# Patient Record
Sex: Female | Born: 1955 | Race: Black or African American | Hispanic: No | Marital: Single | Smoking: Never smoker
Health system: Southern US, Community
[De-identification: ages and names within clinical notes are randomized; demographics above are authoritative.]

## PROBLEM LIST (undated history)

## (undated) DIAGNOSIS — I10 Essential (primary) hypertension: Secondary | ICD-10-CM

## (undated) DIAGNOSIS — M1990 Unspecified osteoarthritis, unspecified site: Secondary | ICD-10-CM

## (undated) DIAGNOSIS — J45909 Unspecified asthma, uncomplicated: Secondary | ICD-10-CM

## (undated) DIAGNOSIS — E119 Type 2 diabetes mellitus without complications: Secondary | ICD-10-CM

## (undated) DIAGNOSIS — T7840XA Allergy, unspecified, initial encounter: Secondary | ICD-10-CM

## (undated) HISTORY — DX: Unspecified asthma, uncomplicated: J45.909

## (undated) HISTORY — DX: Unspecified osteoarthritis, unspecified site: M19.90

## (undated) HISTORY — PX: TUBAL LIGATION: SHX77

## (undated) HISTORY — PX: ABDOMINAL HYSTERECTOMY: SHX81

## (undated) HISTORY — DX: Essential (primary) hypertension: I10

## (undated) HISTORY — DX: Type 2 diabetes mellitus without complications: E11.9

## (undated) HISTORY — DX: Allergy, unspecified, initial encounter: T78.40XA

---

## 1999-12-24 ENCOUNTER — Encounter: Admission: RE | Admit: 1999-12-24 | Discharge: 1999-12-24 | Payer: Self-pay | Admitting: Gynecology

## 1999-12-24 ENCOUNTER — Encounter: Payer: Self-pay | Admitting: Family Medicine

## 2000-11-03 ENCOUNTER — Encounter: Admission: RE | Admit: 2000-11-03 | Discharge: 2001-02-01 | Payer: Self-pay | Admitting: Family Medicine

## 2001-01-14 ENCOUNTER — Encounter: Admission: RE | Admit: 2001-01-14 | Discharge: 2001-01-14 | Payer: Self-pay | Admitting: *Deleted

## 2001-01-14 ENCOUNTER — Encounter: Payer: Self-pay | Admitting: *Deleted

## 2002-01-18 ENCOUNTER — Encounter: Admission: RE | Admit: 2002-01-18 | Discharge: 2002-01-18 | Payer: Self-pay | Admitting: *Deleted

## 2002-01-18 ENCOUNTER — Encounter: Payer: Self-pay | Admitting: *Deleted

## 2003-03-30 ENCOUNTER — Encounter: Admission: RE | Admit: 2003-03-30 | Discharge: 2003-03-30 | Payer: Self-pay | Admitting: Internal Medicine

## 2003-03-30 ENCOUNTER — Encounter: Payer: Self-pay | Admitting: Internal Medicine

## 2008-11-01 ENCOUNTER — Encounter: Admission: RE | Admit: 2008-11-01 | Discharge: 2008-11-01 | Payer: Self-pay | Admitting: Family Medicine

## 2009-01-09 ENCOUNTER — Encounter: Admission: RE | Admit: 2009-01-09 | Discharge: 2009-01-09 | Payer: Self-pay | Admitting: Family Medicine

## 2010-09-29 ENCOUNTER — Encounter: Payer: Self-pay | Admitting: Family Medicine

## 2011-03-14 ENCOUNTER — Other Ambulatory Visit: Payer: Self-pay | Admitting: Family Medicine

## 2011-03-14 DIAGNOSIS — Z1231 Encounter for screening mammogram for malignant neoplasm of breast: Secondary | ICD-10-CM

## 2011-04-01 ENCOUNTER — Ambulatory Visit
Admission: RE | Admit: 2011-04-01 | Discharge: 2011-04-01 | Disposition: A | Discharge status: Home / Self Care | Payer: BC Managed Care – PPO | Source: Ambulatory Visit | Attending: Family Medicine | Admitting: Family Medicine

## 2011-04-01 DIAGNOSIS — Z1231 Encounter for screening mammogram for malignant neoplasm of breast: Secondary | ICD-10-CM

## 2012-03-01 ENCOUNTER — Other Ambulatory Visit: Payer: Self-pay | Admitting: Family Medicine

## 2012-03-01 ENCOUNTER — Ambulatory Visit (INDEPENDENT_AMBULATORY_CARE_PROVIDER_SITE_OTHER): Payer: Federal, State, Local not specified - PPO | Admitting: Family Medicine

## 2012-03-01 VITALS — BP 143/84 | HR 82 | Temp 98.2°F | Resp 16 | Ht 64.0 in | Wt 258.2 lb

## 2012-03-01 DIAGNOSIS — R21 Rash and other nonspecific skin eruption: Secondary | ICD-10-CM

## 2012-03-01 DIAGNOSIS — R252 Cramp and spasm: Secondary | ICD-10-CM

## 2012-03-01 DIAGNOSIS — M549 Dorsalgia, unspecified: Secondary | ICD-10-CM

## 2012-03-01 DIAGNOSIS — R269 Unspecified abnormalities of gait and mobility: Secondary | ICD-10-CM

## 2012-03-01 DIAGNOSIS — E119 Type 2 diabetes mellitus without complications: Secondary | ICD-10-CM | POA: Insufficient documentation

## 2012-03-01 DIAGNOSIS — E669 Obesity, unspecified: Secondary | ICD-10-CM

## 2012-03-01 LAB — BASIC METABOLIC PANEL
BUN: 19 mg/dL (ref 6–23)
CO2: 28 mEq/L (ref 19–32)
Calcium: 9.3 mg/dL (ref 8.4–10.5)
Glucose, Bld: 314 mg/dL — ABNORMAL HIGH (ref 70–99)
Sodium: 138 mEq/L (ref 135–145)

## 2012-03-01 MED ORDER — TRIAMCINOLONE ACETONIDE 0.1 % EX CREA: TOPICAL_CREAM | Freq: Two times a day (BID) | CUTANEOUS | Status: AC

## 2012-03-01 MED ORDER — CYCLOBENZAPRINE HCL 10 MG PO TABS: 10.0000 mg | ORAL_TABLET | Freq: Two times a day (BID) | ORAL | Status: AC | PRN

## 2012-03-01 MED ORDER — METFORMIN HCL ER (MOD) 500 MG PO TB24: 500.0000 mg | ORAL_TABLET | Freq: Two times a day (BID) | ORAL | Status: DC

## 2012-03-01 MED ORDER — METFORMIN HCL ER (OSM) 500 MG PO TB24: ORAL_TABLET | ORAL | Status: DC

## 2012-03-01 NOTE — Progress Notes (Signed)
Patient Name: Sarah Barron Date of Birth: 02-26-1956 Medical Record Number: [id number] Gender: female Date of Encounter: 03/01/2012  History of Present Illness:  Sarah Barron is a 56 y.o. very pleasant female patient who presents with the following:  She is here with back pain and a rash (rash is not on her back) today. The back pain started about a week ago.  Insidious onset, in her lower back- in the middle.  No known injury.  She thinks she has a pulled muscle.  She works for the [employer].  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  She has had some lower back pain with occasional flares in the past.  She started her current job about 1 year ago and this has caused more frequent flares.  She feels that she is very tired by the end of the week.    She also notes an itchy rash which is on both arms. She first noted this about a week ago as well.  She does tend to have allergies and has been outside some.  She has not tried any creams for this yet.   She has NIDDM.  She has actually been out of her metformin for a few weeks, and is not sure when her last A1c was done.   Also has noted some leg cramps for the last several months.  These are worse at night also.   There is no problem list on file for this patient.  No past medical history on file. No past surgical history on file. History  Substance Use Topics  . Smoking status: Not on file  . Smokeless tobacco: Not on file  . Alcohol Use: Not on file   No family history on file. No Known Allergies  Medication list has been reviewed and updated.  Prior to Admission medications   Medication Sig Start Date End Date Taking? Authorizing Provider  lisinopril-hydrochlorothiazide (PRINZIDE,ZESTORETIC) 20-25 MG per tablet Take 1 tablet by mouth daily.   Yes Historical Provider, MD  metFORMIN (GLUMETZA) 500 MG (MOD) 24 hr tablet Take 500 mg by mouth 2 (two) times daily with a meal. Per patient takes 4 tabs daily   Yes  Historical Provider, MD    Review of Systems:  As per HPI- otherwise negative.   Physical Examination: Filed Vitals:   03/01/12 0945  BP: 143/84  Pulse: 82  Temp: 98.2 F (36.8 C)  Resp: 16   Filed Vitals:   03/01/12 0945  Height: 5\' 4"  (1.626 m)  Weight: 258 lb 3.2 oz (117.119 kg)   Body mass index is 44.32 kg/(m^2). Ideal Body Weight: Weight in (lb) to have BMI = 25: 145.3   GEN: WDWN, NAD, Non-toxic, A & O x 3, obese HEENT: Atraumatic, Normocephalic. Neck supple. No masses, No LAD.  Tm and oropharynx wnl, PEERL, EOMI Ears and Nose: No external deformity. CV: RRR, No M/G/R. No JVD. No thrill. No extra heart sounds. PULM: CTA B, no wheezes, crackles, rhonchi. No retractions. No resp. distress. No accessory muscle use. ABD: S, NT, ND, +BS. No rebound. No HSM. EXTR: No c/c/e.  Normal calves NEURO Normal gait.  PSYCH: Normally interactive. Conversant. Not depressed or anxious appearing.  Calm demeanor.  Back: there is tenderness over her lumbar spine, but she has full ROM and does not have any numbness or tingling of her legs.  Normal LE strength, sensation and DTR.    Rash: there is a diffuse papular rash on her  forearms, worse over her left forearm.  It appears consistent with a contact dermatitis.  No other areas affected, hands are spared.  Results for orders placed in visit on 03/01/12  POCT GLYCOSYLATED HEMOGLOBIN (HGB A1C)      Component Value Range   Hemoglobin A1C 8.5     Assessment and Plan: 1. Back pain  cyclobenzaprine (FLEXERIL) 10 MG tablet  2. DM (diabetes mellitus)  POCT glycosylated hemoglobin (Hb A1C), metFORMIN (GLUMETZA) 500 MG (MOD) 24 hr tablet  3. Obesity    4. Rash  triamcinolone cream (KENALOG) 0.1 %  5. Leg cramps  Basic metabolic panel   Will treat rash with triamcinolone, and refilled her metformin.  As she has noted some GI symptoms with 2000mg  will try just 1000mg  at night and see how this affects her A1c.  Counseled that she really needs  to lose weight and exercise more- deconditioning and morbid obesity are not only contributing to her DM, but I suspect they are the cause of her back pain as well.  At the end of our visit Sarah Barron mentioned that she will probably also need for me to fill out FMLA paperwork for her to take "some time off to rest" from her job.  I am willing to give her some short term days to use PRN, but she will need to work on her lifestyle as well.  She is about 100 lbs over her ideal weight currently. She plans to follow- up with [place] soon- I will also be in touch with her pending her BMP which was done in response to leg cramps.    Abbe Amsterdam, MD

## 2012-03-02 ENCOUNTER — Encounter: Payer: Self-pay | Admitting: Family Medicine

## 2012-03-18 ENCOUNTER — Telehealth: Payer: Self-pay

## 2012-03-18 ENCOUNTER — Ambulatory Visit (INDEPENDENT_AMBULATORY_CARE_PROVIDER_SITE_OTHER): Payer: Federal, State, Local not specified - PPO | Admitting: Physician Assistant

## 2012-03-18 VITALS — BP 138/82 | HR 87 | Temp 98.6°F | Resp 17 | Ht 65.0 in | Wt 258.0 lb

## 2012-03-18 DIAGNOSIS — J069 Acute upper respiratory infection, unspecified: Secondary | ICD-10-CM

## 2012-03-18 DIAGNOSIS — R21 Rash and other nonspecific skin eruption: Secondary | ICD-10-CM

## 2012-03-18 DIAGNOSIS — R05 Cough: Secondary | ICD-10-CM

## 2012-03-18 MED ORDER — ALBUTEROL SULFATE (2.5 MG/3ML) 0.083% IN NEBU
2.5000 mg | INHALATION_SOLUTION | Freq: Once | RESPIRATORY_TRACT | Status: AC
  Administered 2012-03-18: 2.5 mg via RESPIRATORY_TRACT

## 2012-03-18 MED ORDER — PROMETHAZINE-DM 6.25-15 MG/5ML PO SYRP: 5.0000 mL | ORAL_SOLUTION | Freq: Every day | ORAL | Status: AC

## 2012-03-18 MED ORDER — IPRATROPIUM BROMIDE 0.06 % NA SOLN: 2.0000 | Freq: Four times a day (QID) | NASAL | Status: DC

## 2012-03-18 MED ORDER — ALBUTEROL SULFATE HFA 108 (90 BASE) MCG/ACT IN AERS: 2.0000 | INHALATION_SPRAY | RESPIRATORY_TRACT | Status: DC | PRN

## 2012-03-18 NOTE — Progress Notes (Signed)
  Subjective:    Patient ID: Sarah Barron, female    DOB: Jul 22, 1956, 56 y.o.   MRN: [id number]  HPI Sarah Barron comes in today with 4 days of URI symptoms.  ST, scratchy throat, nasal congestion, tight cough that hurts her chest. She has not had fever or chills.  She states her cough is productive at times and that she has asthma but no current inhalers.    She is transitioning her care here from [facility] [address] and asks for FMLA papers to be filled out for DM and asthma. We do not have records from [facility] at this point.  She still has a rash on her forearms that did not respond to TAC cream.  It is very itchy and she states that it is getting worse.  It is not on her body any where else.    Review of Systems As noted in HPI, otherwise negative     Objective:   Physical Exam  Constitutional: She is oriented to person, place, and time. She appears well-developed and well-nourished.  HENT:  Right Ear: Tympanic membrane normal.  Left Ear: Tympanic membrane normal.  Nose: Mucosal edema present.  Mouth/Throat: Oropharynx is clear and moist.  Cardiovascular: Normal rate and regular rhythm.   Pulmonary/Chest: Effort normal. She has wheezes (faint wheezes throughout).       Tight paroxysmal cough  Lymphadenopathy:    She has no cervical adenopathy.  Neurological: She is alert and oriented to person, place, and time.  Skin: Skin is warm. Rash noted.       Left dorsal forearm with multiple scattered papular lesions with a sebaceous appearance to it. Only to elbow.  A few scattered lesions on right forearm.    Albuterol Neb treatment x 1 Poor effort with pre peakflow  Pt clears after nebulizer.  PF 275 post       Assessment & Plan:  URI Asthma Rash DM, uncontrolled   Altrovent NS, Albuterol Inhaler, phenergan DM Add zyrtec for rash.  If no resolution, RTC for punch If asthma worsens RTC.  Prednisone held for now as BS are uncontrolled and not monitored at home.  If pt  worsens, she may need treatment. Stated to pt that if she was able to provide [place] with records we could consider FMLA but there is no guarantee that we will fill it out.  Seen with Dr. Perrin Maltese

## 2012-03-18 NOTE — Patient Instructions (Addendum)
Get Zyrtec over the counter for itching and allergies If wheezing and cough get worse, call or return to clinic Watch rash.  If it does not respond return to clinic for a biopsy

## 2012-04-06 ENCOUNTER — Ambulatory Visit (INDEPENDENT_AMBULATORY_CARE_PROVIDER_SITE_OTHER): Payer: Federal, State, Local not specified - PPO | Admitting: Physician Assistant

## 2012-04-06 VITALS — BP 174/78 | HR 96 | Temp 98.2°F | Resp 18 | Ht 65.5 in | Wt 264.0 lb

## 2012-04-06 DIAGNOSIS — L282 Other prurigo: Secondary | ICD-10-CM

## 2012-04-06 DIAGNOSIS — J45909 Unspecified asthma, uncomplicated: Secondary | ICD-10-CM

## 2012-04-06 MED ORDER — FLUTICASONE-SALMETEROL 100-50 MCG/DOSE IN AEPB: 1.0000 | INHALATION_SPRAY | Freq: Two times a day (BID) | RESPIRATORY_TRACT | Status: DC

## 2012-04-06 MED ORDER — ALBUTEROL SULFATE HFA 108 (90 BASE) MCG/ACT IN AERS: 2.0000 | INHALATION_SPRAY | RESPIRATORY_TRACT | Status: DC | PRN

## 2012-04-06 NOTE — Progress Notes (Signed)
  Subjective:    Patient ID: Sarah Barron, female    DOB: December 21, 1955, 56 y.o.   MRN: [id number]  HPI Patient presents for follow up of bilateral upper extremity rash. She has had this rash for about 2 months and has been seen several times. She has been using triamcinolone cream which does help but only temporarily.  She believes it could be related to her job. She works for [employer] in a dusty environment. Believes this has caused the rash and her asthma to flare. She is having to use her albuterol inhaler daily.  Currently uses only albuterol inhaler and does not have a daily steroid inhaler.     Review of Systems  All other systems reviewed and are negative.       Objective:   Physical Exam  Constitutional: She is oriented to person, place, and time. She appears well-developed and well-nourished.  HENT:  Head: Normocephalic and atraumatic.  Right Ear: Hearing, tympanic membrane, external ear and ear canal normal.  Left Ear: Hearing, tympanic membrane, external ear and ear canal normal.  Mouth/Throat: Uvula is midline, oropharynx is clear and moist and mucous membranes are normal. No oropharyngeal exudate.  Neck: Normal range of motion.  Cardiovascular: Normal rate, regular rhythm and normal heart sounds.   Pulmonary/Chest: Effort normal and breath sounds normal.  Musculoskeletal: Normal range of motion.  Lymphadenopathy:    She has no cervical adenopathy.  Neurological: She is alert and oriented to person, place, and time.  Skin:     Psychiatric: She has a normal mood and affect. Her behavior is normal. Judgment and thought content normal.          Assessment & Plan:   1. Pruritic rash  Ambulatory referral to Dermatology  2. Asthma  albuterol (PROVENTIL HFA;VENTOLIN HFA) 108 (90 BASE) MCG/ACT inhaler, Fluticasone-Salmeterol (ADVAIR) 100-50 MCG/DOSE AEPB   Recommend continue triamcinolone until follow up with dermatology. Patient did not want a punch biopsy today.   She  left FMLA paperwork that I put in Maudia's box for completion.   Start Advair inhaler daily. Continue albuterol as needed as well as Zyrtec daily.

## 2012-04-13 ENCOUNTER — Encounter: Payer: Federal, State, Local not specified - PPO | Admitting: Family Medicine

## 2012-04-29 ENCOUNTER — Ambulatory Visit (INDEPENDENT_AMBULATORY_CARE_PROVIDER_SITE_OTHER): Payer: Federal, State, Local not specified - PPO | Admitting: Emergency Medicine

## 2012-04-29 VITALS — BP 120/98 | HR 79 | Temp 97.8°F | Resp 16 | Ht 65.0 in | Wt 267.0 lb

## 2012-04-29 DIAGNOSIS — J029 Acute pharyngitis, unspecified: Secondary | ICD-10-CM

## 2012-04-29 DIAGNOSIS — R05 Cough: Secondary | ICD-10-CM

## 2012-04-29 DIAGNOSIS — J45909 Unspecified asthma, uncomplicated: Secondary | ICD-10-CM

## 2012-04-29 DIAGNOSIS — E119 Type 2 diabetes mellitus without complications: Secondary | ICD-10-CM

## 2012-04-29 DIAGNOSIS — Z9109 Other allergy status, other than to drugs and biological substances: Secondary | ICD-10-CM

## 2012-04-29 DIAGNOSIS — J309 Allergic rhinitis, unspecified: Secondary | ICD-10-CM

## 2012-04-29 LAB — GLUCOSE, POCT (MANUAL RESULT ENTRY): POC Glucose: 171 mg/dl — AB (ref 70–99)

## 2012-04-29 MED ORDER — FLUTICASONE PROPIONATE 50 MCG/ACT NA SUSP: 2.0000 | Freq: Every day | NASAL | Status: DC

## 2012-04-29 MED ORDER — ALBUTEROL SULFATE (2.5 MG/3ML) 0.083% IN NEBU
2.5000 mg | INHALATION_SOLUTION | Freq: Once | RESPIRATORY_TRACT | Status: AC
  Administered 2012-04-29: 2.5 mg via RESPIRATORY_TRACT

## 2012-04-29 MED ORDER — BUDESONIDE-FORMOTEROL FUMARATE 160-4.5 MCG/ACT IN AERO: 2.0000 | INHALATION_SPRAY | Freq: Two times a day (BID) | RESPIRATORY_TRACT | Status: DC

## 2012-04-29 MED ORDER — PREDNISONE 20 MG PO TABS: ORAL_TABLET | ORAL | Status: DC

## 2012-04-29 NOTE — Progress Notes (Signed)
  Subjective:    Patient ID: Sarah Barron, female    DOB: [birth year], 56 y.o.   MRN: [id number]  HPI patient has a long history of allergies and asthma she tried the Advair but did not like it she said it made her sleepy. She has a significant amount of nasal congestion. She has not had any purulent nasal drainage nor has she had a productive cough.    Review of Systems     Objective:   Physical Exam HEENT exam reveals TMs clear nose congested throat is red. Chest is clear to auscultation and percussion without definite wheezes. There does appear to be some prolongation of expiration.   Results for orders placed in visit on 04/29/12  POCT RAPID STREP A (OFFICE)      Component Value Range   Rapid Strep A Screen Negative  Negative   Results for orders placed in visit on 04/29/12  POCT RAPID STREP A (OFFICE)      Component Value Range   Rapid Strep A Screen Negative  Negative  GLUCOSE, POCT (MANUAL RESULT ENTRY)      Component Value Range   POC Glucose 171 (*) 70 - 99 mg/dl      Assessment & Plan:  I suspect that his symptoms are allergy related. I have switched from Advair to Symbicort and use a Flonase nasal inhaler

## 2012-04-29 NOTE — Patient Instructions (Addendum)
Please try the inhalers and see if this helps with your asthma.Allergic Rhinitis Allergic rhinitis is when the mucous membranes in the nose respond to allergens. Allergens are particles in the air that cause your body to have an allergic reaction. This causes you to release allergic antibodies. Through a chain of events, these eventually cause you to release histamine into the blood stream (hence the use of antihistamines). Although meant to be protective to the body, it is this release that causes your discomfort, such as frequent sneezing, congestion and an itchy runny nose.  CAUSES  The pollen allergens may come from grasses, trees, and weeds. This is seasonal allergic rhinitis, or "hay fever." Other allergens cause year-round allergic rhinitis (perennial allergic rhinitis) such as house dust mite allergen, pet dander and mold spores.  SYMPTOMS   Nasal stuffiness (congestion).   Runny, itchy nose with sneezing and tearing of the eyes.   There is often an itching of the mouth, eyes and ears.  It cannot be cured, but it can be controlled with medications. DIAGNOSIS  If you are unable to determine the offending allergen, skin or blood testing may find it. TREATMENT   Avoid the allergen.   Medications and allergy shots (immunotherapy) can help.   Hay fever may often be treated with antihistamines in pill or nasal spray forms. Antihistamines block the effects of histamine. There are over-the-counter medicines that may help with nasal congestion and swelling around the eyes. Check with your caregiver before taking or giving this medicine.  If the treatment above does not work, there are many new medications your caregiver can prescribe. Stronger medications may be used if initial measures are ineffective. Desensitizing injections can be used if medications and avoidance fails. Desensitization is when a patient is given ongoing shots until the body becomes less sensitive to the allergen. Make sure  you follow up with your caregiver if problems continue. SEEK MEDICAL CARE IF:   You develop fever (more than 100.5 F (38.1 C).   You develop a cough that does not stop easily (persistent).   You have shortness of breath.   You start wheezing.   Symptoms interfere with normal daily activities.  Document Released: 05/20/2001 Document Revised: 08/14/2011 Document Reviewed: 11/29/2008 [facility] Patient Information 2012 [place], [place].

## 2012-05-18 ENCOUNTER — Ambulatory Visit (INDEPENDENT_AMBULATORY_CARE_PROVIDER_SITE_OTHER): Payer: Federal, State, Local not specified - PPO | Admitting: Emergency Medicine

## 2012-05-18 VITALS — BP 191/121 | HR 106 | Temp 98.2°F | Resp 28

## 2012-05-18 DIAGNOSIS — J45901 Unspecified asthma with (acute) exacerbation: Secondary | ICD-10-CM

## 2012-05-18 MED ORDER — ALBUTEROL SULFATE (2.5 MG/3ML) 0.083% IN NEBU: 5.0000 mg | INHALATION_SOLUTION | Freq: Once | RESPIRATORY_TRACT | Status: DC

## 2012-05-18 MED ORDER — IPRATROPIUM BROMIDE 0.02 % IN SOLN: 0.5000 mg | Freq: Once | RESPIRATORY_TRACT | Status: DC

## 2012-05-18 NOTE — Progress Notes (Signed)
  Subjective:    Patient ID: Sarah Barron, female    DOB: May 23, 1956, 56 y.o.   MRN: [id number]  Asthma She complains of chest tightness, cough, difficulty breathing, shortness of breath and wheezing. There is no frequent throat clearing, hemoptysis, hoarse voice or sputum production. This is a new problem. The current episode started today. The problem occurs constantly. The problem has been gradually worsening. The cough is non-productive. Associated symptoms include dyspnea on exertion, malaise/fatigue, nasal congestion, postnasal drip and rhinorrhea. Pertinent negatives include no appetite change, chest pain, ear congestion, ear pain, fever, headaches, heartburn, myalgias, orthopnea, PND, sneezing, sore throat, sweats, trouble swallowing or weight loss. Her symptoms are aggravated by climbing stairs and exercise. Her symptoms are alleviated by nothing. She reports no improvement on treatment. Risk factors for lung disease include no known risk factors. Her past medical history is significant for asthma. There is no history of bronchiectasis, bronchitis, COPD, emphysema or pneumonia.      Review of Systems  Constitutional: Positive for malaise/fatigue. Negative for fever, weight loss and appetite change.  HENT: Positive for rhinorrhea and postnasal drip. Negative for ear pain, sore throat, hoarse voice, sneezing and trouble swallowing.   Eyes: Negative.   Respiratory: Positive for cough, shortness of breath and wheezing. Negative for hemoptysis and sputum production.   Cardiovascular: Positive for dyspnea on exertion. Negative for chest pain and PND.  Gastrointestinal: Negative.  Negative for heartburn.  Genitourinary: Negative.   Musculoskeletal: Negative.  Negative for myalgias.  Neurological: Negative for headaches.       Objective:   Physical Exam  Constitutional: She is oriented to person, place, and time. She appears well-developed and well-nourished. She appears distressed.    HENT:  Head: Normocephalic and atraumatic.  Right Ear: External ear normal.  Left Ear: External ear normal.  Mouth/Throat: Oropharynx is clear and moist.  Eyes: Conjunctivae are normal. Pupils are equal, round, and reactive to light.  Neck: Normal range of motion. Neck supple.  Cardiovascular: Normal rate, normal heart sounds and intact distal pulses.  Exam reveals no gallop and no friction rub.   No murmur heard. Pulmonary/Chest: She is in respiratory distress. She has wheezes. She has no rales. She exhibits no tenderness.  Abdominal: Soft.  Musculoskeletal: Normal range of motion.  Neurological: She is alert and oriented to person, place, and time.  Skin: Skin is warm and dry.          Assessment & Plan:  Exacerbation asthma.  Used her albuterol MDI twice today with no improvement;  1620:  Still wheeze free, breathing easily with good full respirations.  To use albuterol q4h prn  I have reviewed and agree with documentation. Robert P. Merla Riches, M.D.

## 2012-05-21 ENCOUNTER — Ambulatory Visit (INDEPENDENT_AMBULATORY_CARE_PROVIDER_SITE_OTHER): Payer: Federal, State, Local not specified - PPO | Admitting: Family Medicine

## 2012-05-21 VITALS — BP 174/96 | HR 112 | Temp 98.3°F | Resp 20 | Wt 260.0 lb

## 2012-05-21 DIAGNOSIS — E109 Type 1 diabetes mellitus without complications: Secondary | ICD-10-CM

## 2012-05-21 DIAGNOSIS — E119 Type 2 diabetes mellitus without complications: Secondary | ICD-10-CM

## 2012-05-21 DIAGNOSIS — J45901 Unspecified asthma with (acute) exacerbation: Secondary | ICD-10-CM

## 2012-05-21 LAB — POCT CBC
Granulocyte percent: 57.5 %G (ref 37–80)
HCT, POC: 48.4 % — AB (ref 37.7–47.9)
Hemoglobin: 14.7 g/dL (ref 12.2–16.2)
Lymph, poc: 3.4 (ref 0.6–3.4)
MCH, POC: 29.7 pg (ref 27–31.2)
MCHC: 30.4 g/dL — AB (ref 31.8–35.4)
MCV: 97.7 fL — AB (ref 80–97)
MID (cbc): 0.7 (ref 0–0.9)
MPV: 8.6 fL (ref 0–99.8)
POC Granulocyte: 5.5 (ref 2–6.9)
POC LYMPH PERCENT: 35.2 %L (ref 10–50)
POC MID %: 7.3 %M (ref 0–12)
Platelet Count, POC: 378 10*3/uL (ref 142–424)
RBC: 4.95 M/uL (ref 4.04–5.48)
RDW, POC: 13.6 %
WBC: 9.6 10*3/uL (ref 4.6–10.2)

## 2012-05-21 LAB — POCT GLYCOSYLATED HEMOGLOBIN (HGB A1C): Hemoglobin A1C: 8.8

## 2012-05-21 LAB — GLUCOSE, POCT (MANUAL RESULT ENTRY): POC Glucose: 358 mg/dl — AB (ref 70–99)

## 2012-05-21 MED ORDER — IPRATROPIUM BROMIDE 0.02 % IN SOLN
0.5000 mg | Freq: Once | RESPIRATORY_TRACT | Status: AC
  Administered 2012-05-21: 0.5 mg via RESPIRATORY_TRACT

## 2012-05-21 MED ORDER — GLIMEPIRIDE 2 MG PO TABS: 2.0000 mg | ORAL_TABLET | Freq: Every day | ORAL | Status: DC

## 2012-05-21 MED ORDER — ALBUTEROL SULFATE (2.5 MG/3ML) 0.083% IN NEBU
2.5000 mg | INHALATION_SOLUTION | Freq: Once | RESPIRATORY_TRACT | Status: AC
  Administered 2012-05-21: 2.5 mg via RESPIRATORY_TRACT

## 2012-05-21 MED ORDER — MOMETASONE FURO-FORMOTEROL FUM 200-5 MCG/ACT IN AERO: 2.0000 | INHALATION_SPRAY | Freq: Two times a day (BID) | RESPIRATORY_TRACT | Status: DC

## 2012-05-21 NOTE — Progress Notes (Signed)
56 yo woman with known asthma who has been short of breath with wheezing over a week, much worse last Tuesday and again today.  No fever.  She is using the Proventil q4h along with the Atrovent and Flonase.  Works with [employer], [employer] (very dusty).  Diabetes has been a little high lately.  Polyuria present with some lightheadedness.  Weak as well   Objective: NAD, audible wheezing  Patient is obese and having mild dyspnea when she walks in to the office. Diffuse wheezes bilaterally but no rales at the bases Heart: Regular with borderline tachycardia, no gallop or murmur Extremities: Pink nailbeds, no edema  Patient was given a nebulizer treatment with Atrovent and albuterol with marked improvement in aeration.  Results for orders placed in visit on 04/29/12  POCT RAPID STREP A (OFFICE)      Component Value Range   Rapid Strep A Screen Negative  Negative  GLUCOSE, POCT (MANUAL RESULT ENTRY)      Component Value Range   POC Glucose 171 (*) 70 - 99 mg/dl   Results for orders placed in visit on 05/21/12  POCT CBC      Component Value Range   WBC 9.6  4.6 - 10.2 K/uL   Lymph, poc 3.4  0.6 - 3.4   POC LYMPH PERCENT 35.2  10 - 50 %L   MID (cbc) 0.7  0 - 0.9   POC MID % 7.3  0 - 12 %M   POC Granulocyte 5.5  2 - 6.9   Granulocyte percent 57.5  37 - 80 %G   RBC 4.95  4.04 - 5.48 M/uL   Hemoglobin 14.7  12.2 - 16.2 g/dL   HCT, POC 62.1 (*) 30.8 - 47.9 %   MCV 97.7 (*) 80 - 97 fL   MCH, POC 29.7  27 - 31.2 pg   MCHC 30.4 (*) 31.8 - 35.4 g/dL   RDW, POC 65.7     Platelet Count, POC 378  142 - 424 K/uL   MPV 8.6  0 - 99.8 fL  POCT GLYCOSYLATED HEMOGLOBIN (HGB A1C)      Component Value Range   Hemoglobin A1C 8.8    GLUCOSE, POCT (MANUAL RESULT ENTRY)      Component Value Range   POC Glucose 358 (*) 70 - 99 mg/dl   '  Assessment:  Moderately severe asthma,  Uncontrolled diabetes  Plan:  Add Dulera, recheck 48 hours 1. Asthma exacerbation  albuterol (PROVENTIL) (2.5 MG/3ML)  0.083% nebulizer solution 2.5 mg, ipratropium (ATROVENT) nebulizer solution 0.5 mg, POCT CBC, POCT glycosylated hemoglobin (Hb A1C), POCT glucose (manual entry), Mometasone Furo-Formoterol Fum 200-5 MCG/ACT AERO  2. Type 2 diabetes mellitus not at goal  glimepiride (AMARYL) 2 MG tablet  add amaryl 2 mg daily for better diabetes control

## 2012-05-23 ENCOUNTER — Ambulatory Visit (INDEPENDENT_AMBULATORY_CARE_PROVIDER_SITE_OTHER): Payer: Federal, State, Local not specified - PPO | Admitting: Emergency Medicine

## 2012-05-23 VITALS — BP 153/82 | HR 94 | Temp 98.0°F | Resp 16 | Ht 64.25 in | Wt 256.0 lb

## 2012-05-23 DIAGNOSIS — J45901 Unspecified asthma with (acute) exacerbation: Secondary | ICD-10-CM

## 2012-05-23 DIAGNOSIS — E119 Type 2 diabetes mellitus without complications: Secondary | ICD-10-CM

## 2012-05-23 MED ORDER — GLIMEPIRIDE 2 MG PO TABS: 2.0000 mg | ORAL_TABLET | Freq: Every day | ORAL | Status: DC

## 2012-05-23 NOTE — Progress Notes (Addendum)
Date:  05/23/2012   Name:  Sarah Barron   DOB:  05-03-1956   MRN:  [id number] Gender: female Age: 56 y.o.  PCP:  No primary provider on file.    Chief Complaint: Follow-up   History of Present Illness:  Sarah Barron is a 56 y.o. pleasant patient who presents with the following:  Has been having difficulty for some time with wheezing.  I treated her with neb aerosols and counseling about proper use of her MDI albuterol.  She was seen Friday by Dr Milus Glazier still wheezing and he added Dulera and added amaryl to her NIDDM treatment as her random BS was 170 at that time.  She has not checked her sugar since and says her asthma has improved but is not resolved and she is still wheezing.  No cough or fever, no coryza.  Has not used her albuterol since starting the dulera.  Patient Active Problem List  Diagnosis  . Obesity  . Diabetes mellitus type II    No past medical history on file.  No past surgical history on file.  History  Substance Use Topics  . Smoking status: Never Smoker   . Smokeless tobacco: Not on file  . Alcohol Use: Not on file    No family history on file.  No Known Allergies  Medication list has been reviewed and updated.  Outpatient Prescriptions Prior to Visit  Medication Sig Dispense Refill  . albuterol (PROVENTIL HFA;VENTOLIN HFA) 108 (90 BASE) MCG/ACT inhaler Inhale 2 puffs into the lungs every 4 (four) hours as needed for wheezing.  1 Inhaler  1  . budesonide-formoterol (SYMBICORT) 160-4.5 MCG/ACT inhaler Inhale 2 puffs into the lungs 2 (two) times daily.  1 Inhaler  3  . fluticasone (FLONASE) 50 MCG/ACT nasal spray Place 2 sprays into the nose daily.  16 g  6  . lisinopril-hydrochlorothiazide (PRINZIDE,ZESTORETIC) 20-25 MG per tablet Take 1 tablet by mouth daily.      . metformin (FORTAMET) 500 MG (OSM) 24 hr tablet Take 2 tablets at bedtime  60 tablet  3  . Mometasone Furo-Formoterol Fum 200-5 MCG/ACT AERO Inhale 2 puffs into the lungs 2  (two) times daily.  1 Inhaler  3  . glimepiride (AMARYL) 2 MG tablet Take 1 tablet (2 mg total) by mouth daily before breakfast.  30 tablet  3  . ipratropium (ATROVENT) 0.06 % nasal spray Place 2 sprays into the nose 4 (four) times daily.  15 mL  0  . triamcinolone cream (KENALOG) 0.1 % Apply topically 2 (two) times daily.  60 g  0   Facility-Administered Medications Prior to Visit  Medication Dose Route Frequency Provider Last Rate Last Dose  . albuterol (PROVENTIL) (2.5 MG/3ML) 0.083% nebulizer solution 5 mg  5 mg Nebulization Once Phillips Odor, MD      . ipratropium (ATROVENT) nebulizer solution 0.5 mg  0.5 mg Nebulization Once Phillips Odor, MD        Review of Systems:  As per HPI, otherwise negative.    Physical Examination: Filed Vitals:   05/23/12 1541  BP: 153/82  Pulse: 94  Temp: 98 F (36.7 C)  Resp: 16   Filed Vitals:   05/23/12 1541  Height: 5' 4.25" (1.632 m)  Weight: 256 lb (116.121 kg)   Body mass index is 43.60 kg/(m^2). Ideal Body Weight: Weight in (lb) to have BMI = 25: 146.5   GEN: WDWN, NAD, Non-toxic, A & O x 3 HEENT: Atraumatic, Normocephalic. Neck supple. No  masses, No LAD.  Oropharynx negative Ears and Nose: No external deformity.TM negative CV: RRR, No M/G/R. No JVD. No thrill. No extra heart sounds. PULM: CTA B, crackles, rhonchi. No retractions. No resp. distress. No accessory muscle use.  Fine scattered wheezing  ABD: S, NT, ND, +BS. No rebound. No HSM. EXTR: No c/c/e NEURO Normal gait.  PSYCH: Normally interactive. Conversant. Not depressed or anxious appearing.  Calm demeanor.    Assessment and Plan: Asthma NIDDM Reeducated regarding Albuterol Discussed need to check her FBS daily. Follow up in one month for recheck on NIDDM  Carmelina Dane, MD I have reviewed and agree with documentation. Robert P. Merla Riches, M.D.

## 2012-06-02 ENCOUNTER — Telehealth: Payer: Self-pay | Admitting: Radiology

## 2012-06-02 DIAGNOSIS — E119 Type 2 diabetes mellitus without complications: Secondary | ICD-10-CM

## 2012-06-02 MED ORDER — METFORMIN HCL ER (OSM) 500 MG PO TB24: ORAL_TABLET | ORAL | Status: DC

## 2012-06-02 MED ORDER — GLIMEPIRIDE 2 MG PO TABS: 2.0000 mg | ORAL_TABLET | Freq: Every day | ORAL | Status: DC

## 2012-06-02 MED ORDER — LISINOPRIL-HYDROCHLOROTHIAZIDE 20-25 MG PO TABS: 2.0000 | ORAL_TABLET | Freq: Every day | ORAL | Status: DC

## 2012-06-02 NOTE — Telephone Encounter (Signed)
Patient has come in to office wanting renewals on her Lisinopril 20/25mg   and Metformin (fortamet) 500mg  she also states Dr Dareen Piano gave her another medication for her diabetes at last office visit, but this did not go through to pharmacy. Please advise what she was to have and if we can send these in for her to [facility] [address]. Please advise and I will call patient [phone]

## 2012-06-02 NOTE — Telephone Encounter (Signed)
I instructed her to increase her dose of glimiperide and did not give her an additional medication.  Prescriptions sent

## 2012-06-02 NOTE — Telephone Encounter (Signed)
Left message for him to advise  

## 2012-06-28 ENCOUNTER — Other Ambulatory Visit: Payer: Self-pay | Admitting: Family Medicine

## 2012-06-28 ENCOUNTER — Other Ambulatory Visit: Payer: Self-pay | Admitting: Physician Assistant

## 2012-06-28 NOTE — Telephone Encounter (Signed)
Per last note 05/23/12 pt to follow up on DM in one month, needs OV for more

## 2012-06-28 NOTE — Telephone Encounter (Signed)
Per 05/23/12 note, patient to follow up on DM in one month, needs OV

## 2012-06-29 ENCOUNTER — Other Ambulatory Visit: Payer: Self-pay | Admitting: Radiology

## 2012-06-30 ENCOUNTER — Encounter: Payer: Self-pay | Admitting: *Deleted

## 2012-06-30 DIAGNOSIS — R21 Rash and other nonspecific skin eruption: Secondary | ICD-10-CM | POA: Insufficient documentation

## 2012-08-20 ENCOUNTER — Other Ambulatory Visit: Payer: Self-pay | Admitting: Physician Assistant

## 2012-08-20 NOTE — Telephone Encounter (Signed)
Needs office visit.

## 2012-09-03 ENCOUNTER — Ambulatory Visit (INDEPENDENT_AMBULATORY_CARE_PROVIDER_SITE_OTHER): Payer: Federal, State, Local not specified - PPO | Admitting: Family Medicine

## 2012-09-03 VITALS — BP 150/89 | HR 108 | Temp 100.0°F | Resp 17 | Ht 65.0 in | Wt 258.0 lb

## 2012-09-03 DIAGNOSIS — R52 Pain, unspecified: Secondary | ICD-10-CM

## 2012-09-03 DIAGNOSIS — R509 Fever, unspecified: Secondary | ICD-10-CM

## 2012-09-03 DIAGNOSIS — R05 Cough: Secondary | ICD-10-CM

## 2012-09-03 DIAGNOSIS — R062 Wheezing: Secondary | ICD-10-CM

## 2012-09-03 DIAGNOSIS — J45909 Unspecified asthma, uncomplicated: Secondary | ICD-10-CM

## 2012-09-03 LAB — POCT CBC
HCT, POC: 46.4 % (ref 37.7–47.9)
Hemoglobin: 14.4 g/dL (ref 12.2–16.2)
Lymph, poc: 2.3 (ref 0.6–3.4)
MCH, POC: 30.8 pg (ref 27–31.2)
MCHC: 31 g/dL — AB (ref 31.8–35.4)
MCV: 99.3 fL — AB (ref 80–97)
WBC: 7 10*3/uL (ref 4.6–10.2)

## 2012-09-03 LAB — POCT INFLUENZA A/B
Influenza A, POC: NEGATIVE
Influenza B, POC: NEGATIVE

## 2012-09-03 MED ORDER — ALBUTEROL SULFATE HFA 108 (90 BASE) MCG/ACT IN AERS: 2.0000 | INHALATION_SPRAY | RESPIRATORY_TRACT | Status: DC | PRN

## 2012-09-03 MED ORDER — ALBUTEROL SULFATE (2.5 MG/3ML) 0.083% IN NEBU
2.5000 mg | INHALATION_SOLUTION | Freq: Once | RESPIRATORY_TRACT | Status: AC
  Administered 2012-09-03: 2.5 mg via RESPIRATORY_TRACT

## 2012-09-03 MED ORDER — DOXYCYCLINE HYCLATE 100 MG PO TABS: 100.0000 mg | ORAL_TABLET | Freq: Two times a day (BID) | ORAL | Status: DC

## 2012-09-03 MED ORDER — HYDROCODONE-HOMATROPINE 5-1.5 MG/5ML PO SYRP: 5.0000 mL | ORAL_SOLUTION | Freq: Three times a day (TID) | ORAL | Status: DC | PRN

## 2012-09-03 MED ORDER — OSELTAMIVIR PHOSPHATE 75 MG PO CAPS: 75.0000 mg | ORAL_CAPSULE | Freq: Two times a day (BID) | ORAL | Status: DC

## 2012-09-03 NOTE — Progress Notes (Signed)
[facility] [address], [place] [address] [id number] [phone]  Date:  09/03/2012   Name:  Sarah Barron   DOB:  05-May-1956   MRN:  [id number]  PCP:  No primary provider on file.    Chief Complaint: Cough, Nasal Congestion, Fever and Generalized Body Aches   History of Present Illness:  Sarah Barron is a 56 y.o. very pleasant female patient who presents with the following:  She became ill 2 days ago with aches, fever, cough- she has not checked her temperature at home.    She had a little bit of diarrhea with eating.   She hurts all over, has chills, feels tired.    She has a mild ST  History of DM- A1c in September was 8.8% She doe suffer from asthma- did not get a flu shot this year.  She is not very compliant with her treatment for asthama.  Only uses her albuterol as needed.    Patient Active Problem List  Diagnosis  . Obesity  . Diabetes mellitus type II  . Rash, skin    Past Medical History  Diagnosis Date  . Allergy   . Asthma   . Hypertension     Past Surgical History  Procedure Date  . Abdominal hysterectomy   . Tubal ligation     History  Substance Use Topics  . Smoking status: Never Smoker   . Smokeless tobacco: Not on file  . Alcohol Use: No    History reviewed. No pertinent family history.  No Known Allergies  Medication list has been reviewed and updated.  Current Outpatient Prescriptions on File Prior to Visit  Medication Sig Dispense Refill  . albuterol (PROVENTIL HFA;VENTOLIN HFA) 108 (90 BASE) MCG/ACT inhaler Inhale 2 puffs into the lungs every 4 (four) hours as needed for wheezing.  1 Inhaler  1  . lisinopril-hydrochlorothiazide (PRINZIDE,ZESTORETIC) 20-25 MG per tablet Take 2 tablets by mouth daily.  60 tablet  3  . metformin (FORTAMET) 500 MG (OSM) 24 hr tablet Take 2 tablets at bedtime  60 tablet  3  . metFORMIN (GLUCOPHAGE-XR) 500 MG 24 hr tablet TAKE 2 TABLETS BY MOUTH EVERY NIGHT AT BEDTIME  60 tablet   0  . budesonide-formoterol (SYMBICORT) 160-4.5 MCG/ACT inhaler Inhale 2 puffs into the lungs 2 (two) times daily.  1 Inhaler  3  . fluticasone (FLONASE) 50 MCG/ACT nasal spray Place 2 sprays into the nose daily.  16 g  6  . glimepiride (AMARYL) 2 MG tablet Take 1 tablet (2 mg total) by mouth daily before breakfast.  30 tablet  3  . ipratropium (ATROVENT) 0.06 % nasal spray Place 2 sprays into the nose 4 (four) times daily.  15 mL  0  . Mometasone Furo-Formoterol Fum 200-5 MCG/ACT AERO Inhale 2 puffs into the lungs 2 (two) times daily.  1 Inhaler  3  . triamcinolone cream (KENALOG) 0.1 % Apply topically 2 (two) times daily.  60 g  0   Current Facility-Administered Medications on File Prior to Visit  Medication Dose Route Frequency Provider Last Rate Last Dose  . albuterol (PROVENTIL) (2.5 MG/3ML) 0.083% nebulizer solution 5 mg  5 mg Nebulization Once Phillips Odor, MD      . ipratropium (ATROVENT) nebulizer solution 0.5 mg  0.5 mg Nebulization Once Phillips Odor, MD        Review of Systems:  As per HPI- otherwise negative.   Physical Examination: Filed Vitals:   09/03/12 1753  BP: 150/89  Pulse: 108  Temp: 100 F (37.8 C)  Resp: 17   Filed Vitals:   09/03/12 1753  Height: 5\' 5"  (1.651 m)  Weight: 258 lb (117.028 kg)   Body mass index is 42.93 kg/(m^2). Ideal Body Weight: Weight in (lb) to have BMI = 25: 149.9   GEN: WDWN, NAD, Non-toxic, A & O x 3 HEENT: Atraumatic, Normocephalic. Neck supple. No masses, No LAD. Bilateral TM wnl, oropharynx normal.  PEERL,EOMI.  Nasal congesiton Ears and Nose: No external deformity. CV: RRR, No M/G/R. No JVD. No thrill. No extra heart sounds. PULM: CTA B, no crackles, rhonchi. No retractions. No resp. distress. No accessory muscle use. Wheezing diffusely bilaterally ABD: S, NT, ND, +BS. No rebound. No HSM. EXTR: No c/c/e NEURO Normal gait.  PSYCH: Normally interactive. Conversant. Not depressed or anxious appearing.  Calm demeanor.     Albuterol neg- she felt a lot better, wheezing improved  Results for orders placed in visit on 09/03/12  POCT INFLUENZA A/B      Component Value Range   Influenza A, POC Negative     Influenza B, POC Negative    POCT CBC      Component Value Range   WBC 7.0  4.6 - 10.2 K/uL   Lymph, poc 2.3  0.6 - 3.4   POC LYMPH PERCENT 32.7  10 - 50 %L   MID (cbc) 0.6  0 - 0.9   POC MID % 8.5  0 - 12 %M   POC Granulocyte 4.1  2 - 6.9   Granulocyte percent 58.8  37 - 80 %G   RBC 4.67  4.04 - 5.48 M/uL   Hemoglobin 14.4  12.2 - 16.2 g/dL   HCT, POC 16.1  09.6 - 47.9 %   MCV 99.3 (*) 80 - 97 fL   MCH, POC 30.8  27 - 31.2 pg   MCHC 31.0 (*) 31.8 - 35.4 g/dL   RDW, POC 04.5     Platelet Count, POC 338  142 - 424 K/uL   MPV 8.9  0 - 99.8 fL    Assessment and Plan: 1. Fever  POCT Influenza A/B, POCT CBC  2. Cough  POCT CBC, HYDROcodone-homatropine (HYCODAN) 5-1.5 MG/5ML syrup, oseltamivir (TAMIFLU) 75 MG capsule, doxycycline (VIBRA-TABS) 100 MG tablet, albuterol (PROVENTIL) (2.5 MG/3ML) 0.083% nebulizer solution 2.5 mg  3. Body aches  POCT CBC, oseltamivir (TAMIFLU) 75 MG capsule  4. Asthma  albuterol (PROVENTIL HFA;VENTOLIN HFA) 108 (90 BASE) MCG/ACT inhaler, oseltamivir (TAMIFLU) 75 MG capsule, albuterol (PROVENTIL) (2.5 MG/3ML) 0.083% nebulizer solution 2.5 mg  5. Wheezing  albuterol (PROVENTIL HFA;VENTOLIN HFA) 108 (90 BASE) MCG/ACT inhaler, albuterol (PROVENTIL) (2.5 MG/3ML) 0.083% nebulizer solution 2.5 mg  possible flu but test negative.  Will cover with tamiflu as she is diabetic, but also doxy for possible bronchitis.  Refilled her inhaler and also gave her hycodan cough syrup to use prn  COPLAND,JESSICA, MD

## 2012-09-03 NOTE — Patient Instructions (Addendum)
Let us know if you are not feeling better in the next 2 days- Sooner if worse.

## 2012-09-06 ENCOUNTER — Telehealth: Payer: Self-pay

## 2012-09-06 NOTE — Telephone Encounter (Signed)
Patient requesting a note for being out of work with the flu. Please call when ready

## 2012-09-07 NOTE — Telephone Encounter (Signed)
Note provided

## 2012-09-07 NOTE — Telephone Encounter (Signed)
Called patient left message for her to call back to advise which days she was out and I can get the note.

## 2012-09-20 ENCOUNTER — Other Ambulatory Visit: Payer: Self-pay | Admitting: Physician Assistant

## 2012-10-13 ENCOUNTER — Telehealth: Payer: Self-pay | Admitting: Physician Assistant

## 2012-10-13 MED ORDER — METFORMIN HCL ER 500 MG PO TB24: 1000.0000 mg | ORAL_TABLET | Freq: Every day | ORAL | Status: DC

## 2012-10-13 NOTE — Telephone Encounter (Signed)
Pharmacy requesting refill on metformin ER, pt notified with last RF that she was overdue for OV.  #15 metformin sent to pharmacy.

## 2012-10-20 ENCOUNTER — Ambulatory Visit (INDEPENDENT_AMBULATORY_CARE_PROVIDER_SITE_OTHER): Payer: Federal, State, Local not specified - PPO | Admitting: Emergency Medicine

## 2012-10-20 VITALS — BP 135/78 | HR 81 | Temp 97.6°F | Resp 16 | Ht 65.0 in | Wt 257.0 lb

## 2012-10-20 DIAGNOSIS — E785 Hyperlipidemia, unspecified: Secondary | ICD-10-CM

## 2012-10-20 DIAGNOSIS — J45909 Unspecified asthma, uncomplicated: Secondary | ICD-10-CM

## 2012-10-20 DIAGNOSIS — E119 Type 2 diabetes mellitus without complications: Secondary | ICD-10-CM

## 2012-10-20 DIAGNOSIS — Z23 Encounter for immunization: Secondary | ICD-10-CM

## 2012-10-20 DIAGNOSIS — R062 Wheezing: Secondary | ICD-10-CM

## 2012-10-20 DIAGNOSIS — M25569 Pain in unspecified knee: Secondary | ICD-10-CM

## 2012-10-20 DIAGNOSIS — I10 Essential (primary) hypertension: Secondary | ICD-10-CM

## 2012-10-20 LAB — POCT GLYCOSYLATED HEMOGLOBIN (HGB A1C): Hemoglobin A1C: 9.6

## 2012-10-20 MED ORDER — BECLOMETHASONE DIPROPIONATE 80 MCG/ACT IN AERS: INHALATION_SPRAY | RESPIRATORY_TRACT | Status: DC

## 2012-10-20 MED ORDER — METFORMIN HCL ER 500 MG PO TB24: 1000.0000 mg | ORAL_TABLET | Freq: Every day | ORAL | Status: DC

## 2012-10-20 MED ORDER — ALBUTEROL SULFATE HFA 108 (90 BASE) MCG/ACT IN AERS: 2.0000 | INHALATION_SPRAY | RESPIRATORY_TRACT | Status: DC | PRN

## 2012-10-20 MED ORDER — LISINOPRIL-HYDROCHLOROTHIAZIDE 20-25 MG PO TABS: 2.0000 | ORAL_TABLET | Freq: Every day | ORAL | Status: DC

## 2012-10-20 MED ORDER — METFORMIN HCL ER 500 MG PO TB24: ORAL_TABLET | ORAL | Status: DC

## 2012-10-20 NOTE — Patient Instructions (Addendum)
Please make an appointment to be seen in 3 months at 104 by appointment . Your diabetes is not under control. please take your medications regular and be careful with your diet

## 2012-10-20 NOTE — Progress Notes (Signed)
  Subjective:    Patient ID: Sarah Barron, female    DOB: [birth year], 57 y.o.   MRN: [id number]  HPI 57 year old female refill of medications for hypertension, diabetes, asthma.  Doing well overall, having aches and pain in legs, stands for extended periods of time at work. not on a statin , taking medications regularly, not check blood sugar regularly, problem with asthma more this time of year jan-feb, has albuterol inh, was using symbacort, but did not feel well with it.  Did not get flu shot this year, did have the flu. Would like flu shot today.     Did not have a colonoscopy this year     Review of Systems     Objective:   Physical Exam HEENT exam is unremarkable. Neck is supple. Chest is clear today no wheezes are heard. Cardiac exam reveals a regular rate and rhythm without murmurs rubs or gallops  Results for orders placed in visit on 09/03/12  POCT INFLUENZA A/B      Result Value Range   Influenza A, POC Negative     Influenza B, POC Negative    POCT CBC      Result Value Range   WBC 7.0  4.6 - 10.2 K/uL   Lymph, poc 2.3  0.6 - 3.4   POC LYMPH PERCENT 32.7  10 - 50 %L   MID (cbc) 0.6  0 - 0.9   POC MID % 8.5  0 - 12 %M   POC Granulocyte 4.1  2 - 6.9   Granulocyte percent 58.8  37 - 80 %G   RBC 4.67  4.04 - 5.48 M/uL   Hemoglobin 14.4  12.2 - 16.2 g/dL   HCT, POC 40.9  81.1 - 47.9 %   MCV 99.3 (*) 80 - 97 fL   MCH, POC 30.8  27 - 31.2 pg   MCHC 31.0 (*) 31.8 - 35.4 g/dL   RDW, POC 91.4     Platelet Count, POC 338  142 - 424 K/uL   MPV 8.9  0 - 99.8 fL   Results for orders placed in visit on 10/20/12  GLUCOSE, POCT (MANUAL RESULT ENTRY)      Result Value Range   POC Glucose 198 (*) 70 - 99 mg/dl  POCT GLYCOSYLATED HEMOGLOBIN (HGB A1C)      Result Value Range   Hemoglobin A1C 9.6         Assessment & Plan:

## 2012-11-18 ENCOUNTER — Ambulatory Visit: Payer: Federal, State, Local not specified - PPO

## 2012-11-18 ENCOUNTER — Ambulatory Visit (INDEPENDENT_AMBULATORY_CARE_PROVIDER_SITE_OTHER): Payer: Federal, State, Local not specified - PPO | Admitting: Family Medicine

## 2012-11-18 VITALS — BP 128/72 | HR 82 | Temp 98.2°F | Resp 18 | Ht 64.5 in | Wt 262.0 lb

## 2012-11-18 DIAGNOSIS — R200 Anesthesia of skin: Secondary | ICD-10-CM

## 2012-11-18 DIAGNOSIS — M549 Dorsalgia, unspecified: Secondary | ICD-10-CM

## 2012-11-18 DIAGNOSIS — M25569 Pain in unspecified knee: Secondary | ICD-10-CM

## 2012-11-18 DIAGNOSIS — R209 Unspecified disturbances of skin sensation: Secondary | ICD-10-CM

## 2012-11-18 LAB — POCT CBC
Granulocyte percent: 54.2 %G (ref 37–80)
HCT, POC: 41.2 % (ref 37.7–47.9)
Hemoglobin: 12.6 g/dL (ref 12.2–16.2)
Lymph, poc: 3 (ref 0.6–3.4)
MCH, POC: 30.5 pg (ref 27–31.2)
MCHC: 30.6 g/dL — AB (ref 31.8–35.4)
MCV: 99.8 fL — AB (ref 80–97)
MID (cbc): 0.6 (ref 0–0.9)
MPV: 8.8 fL (ref 0–99.8)
POC Granulocyte: 4.3 (ref 2–6.9)
POC LYMPH PERCENT: 38.6 % (ref 10–50)
POC MID %: 7.2 %M (ref 0–12)
Platelet Count, POC: 354 10*3/uL (ref 142–424)
RBC: 4.13 M/uL (ref 4.04–5.48)
RDW, POC: 13.7 %
WBC: 7.9 10*3/uL (ref 4.6–10.2)

## 2012-11-18 NOTE — Progress Notes (Signed)
[facility]:  Office Visit  Chief Complaint:  Chief Complaint  Patient presents with  . pain in both legs  . swelling in both ankles    HPI: Sarah Barron is a 57 y.o. female who complains of bilateral foot pain and also cramps in legs which has been a longstanding problem but she has had worsening sxs in last 1 month, has poorly contyrolled DM. The leg pain, burning pain gets worse after a long day of standing at her job. She works for the [employer]. She also has intemittent numbness/tingling in hands. Her last HbA1c was 9.6 in Feb 2014. She is trying to lose weight as well. Does not exercise. Denies PF. Denies any joint injuries or pain. She has pain in the back of her foot currently in the right and not left, however sometimes she has it in the left as well. Denies arthritis, osteoporosis or osteopenia. Has back pain, denies hip pain. She has more swelling in feet and ankles at the end of the day.   Past Medical History  Diagnosis Date  . Allergy   . Asthma   . Hypertension   . Diabetes mellitus without complication    Past Surgical History  Procedure Laterality Date  . Abdominal hysterectomy    . Tubal ligation     History   Social History  . Marital Status: Single    Spouse Name: N/A    Number of Children: N/A  . Years of Education: N/A   Social History Main Topics  . Smoking status: Never Smoker   . Smokeless tobacco: None  . Alcohol Use: No  . Drug Use: No  . Sexually Active: No   Other Topics Concern  . None   Social History Narrative  . None   History reviewed. No pertinent family history. No Known Allergies Prior to Admission medications   Medication Sig Start Date End Date Taking? Authorizing Provider  lisinopril-hydrochlorothiazide (PRINZIDE,ZESTORETIC) 20-25 MG per tablet Take 2 tablets by mouth daily. 10/20/12  Yes Collene Gobble, MD  metFORMIN (GLUCOPHAGE-XR) 500 MG 24 hr tablet Take 1 tablet in the morning and 2 tablets  at night 10/20/12  Yes Collene Gobble, MD  albuterol (PROVENTIL HFA;VENTOLIN HFA) 108 (90 BASE) MCG/ACT inhaler Inhale 2 puffs into the lungs every 4 (four) hours as needed for wheezing. 10/20/12 10/20/13  Collene Gobble, MD  beclomethasone (QVAR) 80 MCG/ACT inhaler 2 puffs twice a day followed by rinsing and gargling with water 10/20/12   Collene Gobble, MD  budesonide-formoterol [facility]) 160-4.5 MCG/ACT inhaler Inhale 2 puffs into the lungs 2 (two) times daily. 04/29/12 04/29/13  Collene Gobble, MD  doxycycline (VIBRA-TABS) 100 MG tablet Take 1 tablet (100 mg total) by mouth 2 (two) times daily. 09/03/12   Gwenlyn Found Copland, MD  fluticasone (FLONASE) 50 MCG/ACT nasal spray Place 2 sprays into the nose daily. 04/29/12 04/29/13  Collene Gobble, MD  HYDROcodone-homatropine [facility]) 5-1.5 MG/5ML syrup Take 5 mLs by mouth every 8 (eight) hours as needed for cough. 09/03/12   Pearline Cables, MD  oseltamivir (TAMIFLU) 75 MG capsule Take 1 capsule (75 mg total) by mouth 2 (two) times daily. 09/03/12   Gwenlyn Found Copland, MD  triamcinolone cream (KENALOG) 0.1 % Apply topically 2 (two) times daily. 03/01/12 03/01/13  Gwenlyn Found Copland, MD     ROS: The patient denies fevers, chills, night sweats, unintentional weight loss, chest pain, palpitations, wheezing, dyspnea on exertion, nausea,  vomiting, abdominal pain, dysuria, hematuria, melena All other systems have been reviewed and were otherwise negative with the exception of those mentioned in the HPI and as above.    PHYSICAL EXAM: Filed Vitals:   11/18/12 1709  BP: 128/72  Pulse: 82  Temp: 98.2 F (36.8 C)  Resp: 18   Filed Vitals:   11/18/12 1709  Height: 5' 4.5" (1.638 m)  Weight: 262 lb (118.842 kg)   Body mass index is 44.29 kg/(m^2).  General: Alert, no acute distress, obese AA female  HEENT:  Normocephalic, atraumatic, oropharynx patent.  Cardiovascular:  Regular rate and rhythm, no rubs murmurs or gallops.  No Carotid bruits, radial pulse  intact. + trace pedal edema.  Respiratory: Clear to auscultation bilaterally.  No wheezes, rales, or rhonchi.  No cyanosis, no use of accessory musculature GI: No organomegaly, abdomen is soft and non-tender, positive bowel sounds.  No masses. Skin: No rashes. Neurologic: Facial musculature symmetric. Psychiatric: Patient is appropriate throughout our interaction. Lymphatic: No cervical lymphadenopathy Musculoskeletal: Gait intact. Thoracic-normal L-spine-decrease ROM in flexion, tender bilateral paramsk,  no deformities, 5/5 strength, sensation intact Knees-normal ROM, no deformities, 5/5 strength, sensation intact Right foot-she has a plastic band around her foot. , she has tenderness of the insertion point of achilles to calcaneus,normal ROM, no deformities, 5/5 strength, sensation intact Left foot- NO  tenderness of the insertion point of achilles to calcaneus,normal ROM, no deformities, 5/5 strength, sensation intact Microfilament and vibratory test normal  LABS: Results for orders placed in visit on 11/18/12  POCT CBC      Result Value Range   WBC 7.9  4.6 - 10.2 K/uL   Lymph, poc 3.0  0.6 - 3.4   POC LYMPH PERCENT 38.6  10 - 50 %L   MID (cbc) 0.6  0 - 0.9   POC MID % 7.2  0 - 12 %M   POC Granulocyte 4.3  2 - 6.9   Granulocyte percent 54.2  37 - 80 %G   RBC 4.13  4.04 - 5.48 M/uL   Hemoglobin 12.6  12.2 - 16.2 g/dL   HCT, POC 16.1  09.6 - 47.9 %   MCV 99.8 (*) 80 - 97 fL   MCH, POC 30.5  27 - 31.2 pg   MCHC 30.6 (*) 31.8 - 35.4 g/dL   RDW, POC 04.5     Platelet Count, POC 354  142 - 424 K/uL   MPV 8.8  0 - 99.8 fL     EKG/XRAY:   Primary read interpreted by Dr. Conley Rolls at [facility]. Left foot-subacute fracture of heel spur Right foot-heel spur L-spine -DJD   ASSESSMENT/PLAN: Encounter Diagnoses  Name Primary?  . Pain in joint, lower leg, unspecified laterality Yes  . Numbness and tingling of both legs   . Back pain    Multifactorial cause for foot pain: obesity, poorly  controlled DM, ? Plantar Fasciitis with heels spurs on xrays ( a subacute fx on left calcaneus but she is currently able to bear full weight and does not have pain in that area), achilles tendonitis due to the prior, DJD of lumbar spine.  She would like to go to PT place close to her house. I would like PT to work of ROM of back and PF and achilles tendon exercises. She states she will try to lose weightand get her DM under better control.  Will prescribe meds once get labs back, need to know kidney fxn Consider both neurontin and Mobic prn RICE  for swelling Note for OOW given due to worsening pain while at work, standing. She wants to get her DM under control F/u in 2 weeks    LE, THAO PHUONG, DO 11/18/2012 6:24 PM    11/20/12-Left message regarding labs. She can take neurontin and mobic. F/u in 2 weeks.

## 2012-11-19 LAB — COMPREHENSIVE METABOLIC PANEL WITH GFR
Albumin: 3.9 g/dL (ref 3.5–5.2)
Alkaline Phosphatase: 107 U/L (ref 39–117)
BUN: 18 mg/dL (ref 6–23)
CO2: 31 meq/L (ref 19–32)
Glucose, Bld: 202 mg/dL — ABNORMAL HIGH (ref 70–99)
Sodium: 141 meq/L (ref 135–145)
Total Bilirubin: 0.4 mg/dL (ref 0.3–1.2)
Total Protein: 7.6 g/dL (ref 6.0–8.3)

## 2012-11-19 LAB — COMPREHENSIVE METABOLIC PANEL
ALT: 12 U/L (ref 0–35)
AST: 11 U/L (ref 0–37)
Calcium: 9.3 mg/dL (ref 8.4–10.5)
Chloride: 99 mEq/L (ref 96–112)
Creat: 0.88 mg/dL (ref 0.50–1.10)
Potassium: 3.8 mEq/L (ref 3.5–5.3)

## 2012-11-20 MED ORDER — GABAPENTIN 100 MG PO CAPS: ORAL_CAPSULE | ORAL | Status: DC

## 2012-11-20 MED ORDER — MELOXICAM 7.5 MG PO TABS: 7.5000 mg | ORAL_TABLET | Freq: Every day | ORAL | Status: DC

## 2012-12-14 ENCOUNTER — Ambulatory Visit (INDEPENDENT_AMBULATORY_CARE_PROVIDER_SITE_OTHER): Payer: Federal, State, Local not specified - PPO | Admitting: Family Medicine

## 2012-12-14 VITALS — BP 152/82 | HR 85 | Temp 98.0°F | Resp 18 | Ht 64.0 in | Wt 265.0 lb

## 2012-12-14 DIAGNOSIS — R200 Anesthesia of skin: Secondary | ICD-10-CM

## 2012-12-14 DIAGNOSIS — R209 Unspecified disturbances of skin sensation: Secondary | ICD-10-CM

## 2012-12-14 DIAGNOSIS — M25569 Pain in unspecified knee: Secondary | ICD-10-CM

## 2012-12-14 DIAGNOSIS — R202 Paresthesia of skin: Secondary | ICD-10-CM

## 2012-12-14 MED ORDER — GABAPENTIN 100 MG PO CAPS: ORAL_CAPSULE | ORAL | Status: DC

## 2012-12-14 NOTE — Progress Notes (Signed)
[facility]:  Office Visit  Chief Complaint:  Chief Complaint  Patient presents with  . follow up ankles  . diabetes check    HPI: Sarah Barron is a 57 y.o. female who complains of  Follow-up for bilateral heel pain. Some improvement with PT. Going to [facility], has beeter ROM but still has cramps in legs. Taking her diabetes meds. The last time she came her HbA1c was 9.6 in 10/2012. She denies having neuropathy but it is difficult to determine if she has neuropathy due to her hx. She states she has both foot and heel pain when she is walking at work which makes it worse but also "cramps" at rest. She does not measure her sugars. We tried her on gabapentin 200 mg, she states it is slightly better on meds but she does not know. She has been out of work for the last 1 month so that she can get her diabetes in order. She states she is compliant with her meds and hopes that by taking the meds and going to PT she will be better in 4 weeks. She is wanting me to take her out of work until that time. Xrays from last visit do show bilateral heel spurs.   Past Medical History  Diagnosis Date  . Allergy   . Asthma   . Hypertension   . Diabetes mellitus without complication   . Arthritis    Past Surgical History  Procedure Laterality Date  . Abdominal hysterectomy    . Tubal ligation     History   Social History  . Marital Status: Single    Spouse Name: N/A    Number of Children: N/A  . Years of Education: N/A   Social History Main Topics  . Smoking status: Never Smoker   . Smokeless tobacco: None  . Alcohol Use: No  . Drug Use: No  . Sexually Active: No   Other Topics Concern  . None   Social History Narrative  . None   No family history on file. Not on File Prior to Admission medications   Medication Sig Start Date End Date Taking? Authorizing Provider  albuterol (PROVENTIL HFA;VENTOLIN HFA) 108 (90 BASE) MCG/ACT inhaler Inhale 2 puffs into the  lungs every 4 (four) hours as needed for wheezing. 10/20/12 10/20/13 Yes Collene Gobble, MD  beclomethasone (QVAR) 80 MCG/ACT inhaler 2 puffs twice a day followed by rinsing and gargling with water 10/20/12  Yes Collene Gobble, MD  lisinopril (PRINIVIL,ZESTRIL) 20 MG tablet Take 20 mg by mouth daily.   Yes Historical Provider, MD  meloxicam (MOBIC) 7.5 MG tablet Take 1 tablet (7.5 mg total) by mouth daily. 11/20/12  Yes Thao P Le, DO  metFORMIN (GLUCOPHAGE-XR) 500 MG 24 hr tablet Take 1 tablet in the morning and 2 tablets at night 10/20/12  Yes Collene Gobble, MD  budesonide-formoterol [facility]) 160-4.5 MCG/ACT inhaler Inhale 2 puffs into the lungs 2 (two) times daily. 04/29/12 04/29/13  Collene Gobble, MD  gabapentin (NEURONTIN) 100 MG capsule Take 2 pills PO at bedtime 11/20/12   Thao P Le, DO  lisinopril-hydrochlorothiazide (PRINZIDE,ZESTORETIC) 20-25 MG per tablet Take 2 tablets by mouth daily. 10/20/12   Collene Gobble, MD  triamcinolone cream (KENALOG) 0.1 % Apply topically 2 (two) times daily. 03/01/12 03/01/13  Gwenlyn Found Copland, MD     ROS: The patient denies fevers, chills, night sweats, unintentional weight loss, chest pain, palpitations, wheezing, dyspnea on exertion, nausea,  vomiting, abdominal pain, dysuria, hematuria, melena, weakness  All other systems have been reviewed and were otherwise negative with the exception of those mentioned in the HPI and as above.    PHYSICAL EXAM: Filed Vitals:   12/14/12 1849  BP: 152/82  Pulse: 85  Temp: 98 F (36.7 C)  Resp: 18   Filed Vitals:   12/14/12 1849  Height: 5\' 4"  (1.626 m)  Weight: 265 lb (120.203 kg)   Body mass index is 45.46 kg/(m^2).  General: Alert, no acute distress HEENT:  Normocephalic, atraumatic, oropharynx patent.  Cardiovascular:  Regular rate and rhythm, no rubs murmurs or gallops.  No Carotid bruits, radial pulse intact. Trace pedal edema.  Respiratory: Clear to auscultation bilaterally.  No wheezes, rales, or rhonchi.   No cyanosis, no use of accessory musculature GI: No organomegaly, abdomen is soft and non-tender, positive bowel sounds.  No masses. Skin: No rashes. Neurologic: Facial musculature symmetric. Psychiatric: Patient is appropriate throughout our interaction. Lymphatic: No cervical lymphadenopathy Musculoskeletal: Gait intact. Right foot-tenderness of the insertion point of achilles to calcaneus,normal ROM, no deformities, 5/5 strength, sensation intact  Left foot- tenderness at insertion point of achilles to calcaneus,normal ROM, no deformities, 5/5 strength, sensation intact  Microfilament and vibratory test normal on last exam  LABS: Results for orders placed in visit on 11/18/12  COMPREHENSIVE METABOLIC PANEL      Result Value Range   Sodium 141  135 - 145 mEq/L   Potassium 3.8  3.5 - 5.3 mEq/L   Chloride 99  96 - 112 mEq/L   CO2 31  19 - 32 mEq/L   Glucose, Bld 202 (*) 70 - 99 mg/dL   BUN 18  6 - 23 mg/dL   Creat 1.61  0.96 - 0.45 mg/dL   Total Bilirubin 0.4  0.3 - 1.2 mg/dL   Alkaline Phosphatase 107  39 - 117 U/L   AST 11  0 - 37 U/L   ALT 12  0 - 35 U/L   Total Protein 7.6  6.0 - 8.3 g/dL   Albumin 3.9  3.5 - 5.2 g/dL   Calcium 9.3  8.4 - 40.9 mg/dL  POCT CBC      Result Value Range   WBC 7.9  4.6 - 10.2 K/uL   Lymph, poc 3.0  0.6 - 3.4   POC LYMPH PERCENT 38.6  10 - 50 %L   MID (cbc) 0.6  0 - 0.9   POC MID % 7.2  0 - 12 %M   POC Granulocyte 4.3  2 - 6.9   Granulocyte percent 54.2  37 - 80 %G   RBC 4.13  4.04 - 5.48 M/uL   Hemoglobin 12.6  12.2 - 16.2 g/dL   HCT, POC 81.1  91.4 - 47.9 %   MCV 99.8 (*) 80 - 97 fL   MCH, POC 30.5  27 - 31.2 pg   MCHC 30.6 (*) 31.8 - 35.4 g/dL   RDW, POC 78.2     Platelet Count, POC 354  142 - 424 K/uL   MPV 8.8  0 - 99.8 fL     EKG/XRAY:   Primary read interpreted by Dr. Conley Rolls at [facility].   ASSESSMENT/PLAN: Encounter Diagnoses  Name Primary?  . Pain in joint, lower leg, unspecified laterality Yes  . Numbness and tingling of both  legs    Multifactorial for foot pain, leg cramps: obesity, poorly controlled DM, Plantar fasciitis, deconditioning Increase gabapentin to 300 mg qhs D/w pt PF exercises  need to be also done at home when she is not at PT and needs for better DM control C/w PT. She thinks it is helping some but too early to tell.  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

## 2013-01-10 ENCOUNTER — Ambulatory Visit (INDEPENDENT_AMBULATORY_CARE_PROVIDER_SITE_OTHER): Payer: Federal, State, Local not specified - PPO | Admitting: Family Medicine

## 2013-01-10 VITALS — BP 152/84 | HR 100 | Temp 97.5°F | Resp 18 | Ht 64.5 in | Wt 262.0 lb

## 2013-01-10 DIAGNOSIS — I10 Essential (primary) hypertension: Secondary | ICD-10-CM

## 2013-01-10 DIAGNOSIS — M25579 Pain in unspecified ankle and joints of unspecified foot: Secondary | ICD-10-CM

## 2013-01-10 DIAGNOSIS — R209 Unspecified disturbances of skin sensation: Secondary | ICD-10-CM

## 2013-01-10 DIAGNOSIS — E119 Type 2 diabetes mellitus without complications: Secondary | ICD-10-CM

## 2013-01-10 DIAGNOSIS — R200 Anesthesia of skin: Secondary | ICD-10-CM

## 2013-01-10 DIAGNOSIS — M25569 Pain in unspecified knee: Secondary | ICD-10-CM

## 2013-01-10 LAB — POCT GLYCOSYLATED HEMOGLOBIN (HGB A1C): Hemoglobin A1C: 8.4

## 2013-01-10 MED ORDER — METOPROLOL SUCCINATE ER 50 MG PO TB24: 50.0000 mg | ORAL_TABLET | Freq: Every day | ORAL | Status: DC

## 2013-01-10 MED ORDER — METFORMIN HCL ER 500 MG PO TB24: ORAL_TABLET | ORAL | Status: DC

## 2013-01-10 NOTE — Progress Notes (Signed)
[facility]:  Office Visit  Chief Complaint:  Chief Complaint  Patient presents with  . Ankle Pain    bilateral  . blood glucose follow up    HPI: Sarah Barron is a 57 y.o. female who complains of :  1. HA  In between eyes, not the worst HA of her life, dull ache. She has had HAs like this before but does not always last this long. Denies photophobia/phonophobia. No URI sxs  2. Diabetes-she checks her diabetes and fasting glucose has been better in 150s-200s. Is not UTD on eye exam. She checks feet regular. Denies polydipsia/polyuria. STates she is taking metformin but only 500 mg BID  3. HTN- take medication regular on Lisinopril/HCTZ 20/25 mg BID. No SEs. She did not take her medication yet for this evening. Does not have a BP cuff at home.   3. Foot pain-most likely diabetic neuropathy with achilles tendonitis, has heel spurs on xray. She works at [employer] but has been taking a lot of time off since the has the time to take off and can do it. She was supposed to go to PT but has not been doing that. She tried increasing her neurontin and that helped a lot.  Past Medical History  Diagnosis Date  . Allergy   . Asthma   . Hypertension   . Diabetes mellitus without complication   . Arthritis    Past Surgical History  Procedure Laterality Date  . Abdominal hysterectomy    . Tubal ligation     History   Social History  . Marital Status: Single    Spouse Name: N/A    Number of Children: N/A  . Years of Education: N/A   Social History Main Topics  . Smoking status: Never Smoker   . Smokeless tobacco: None  . Alcohol Use: No  . Drug Use: No  . Sexually Active: No   Other Topics Concern  . None   Social History Narrative  . None   No family history on file. No Known Allergies Prior to Admission medications   Medication Sig Start Date End Date Taking? Authorizing Provider  albuterol (PROVENTIL HFA;VENTOLIN HFA) 108 (90 BASE) MCG/ACT  inhaler Inhale 2 puffs into the lungs every 4 (four) hours as needed for wheezing. 10/20/12 10/20/13 Yes Collene Gobble, MD  beclomethasone (QVAR) 80 MCG/ACT inhaler 2 puffs twice a day followed by rinsing and gargling with water 10/20/12  Yes Collene Gobble, MD  budesonide-formoterol [facility]) 160-4.5 MCG/ACT inhaler Inhale 2 puffs into the lungs 2 (two) times daily. 04/29/12 04/29/13 Yes Collene Gobble, MD  gabapentin (NEURONTIN) 100 MG capsule Take 3 pills PO at bedtime 12/14/12  Yes Linnae Rasool P Sanayah Munro, DO  lisinopril (PRINIVIL,ZESTRIL) 20 MG tablet Take 20 mg by mouth daily.   Yes Historical Provider, MD  metFORMIN (GLUCOPHAGE-XR) 500 MG 24 hr tablet Take 1 tablet in the morning and 2 tablets at night 10/20/12  Yes Collene Gobble, MD  lisinopril-hydrochlorothiazide (PRINZIDE,ZESTORETIC) 20-25 MG per tablet Take 2 tablets by mouth daily. 10/20/12   Collene Gobble, MD  meloxicam (MOBIC) 7.5 MG tablet Take 1 tablet (7.5 mg total) by mouth daily. 11/20/12   Normal Recinos P Leontina Skidmore, DO  triamcinolone cream (KENALOG) 0.1 % Apply topically 2 (two) times daily. 03/01/12 03/01/13  Gwenlyn Found Copland, MD     ROS: The patient denies fevers, chills, night sweats, unintentional weight loss, chest pain, palpitations, wheezing, dyspnea on exertion, nausea, vomiting, abdominal  pain, dysuria, hematuria, melena, numbness, weakness, or tingling.   All other systems have been reviewed and were otherwise negative with the exception of those mentioned in the HPI and as above.    PHYSICAL EXAM: Filed Vitals:   01/10/13 1750  BP: 152/84  Pulse: 100  Temp: 97.5 F (36.4 C)  Resp: 18   Filed Vitals:   01/10/13 1750  Height: 5' 4.5" (1.638 m)  Weight: 262 lb (118.842 kg)   Body mass index is 44.29 kg/(m^2).  General: Alert, no acute distress, morbidly obese AA female HEENT:  Normocephalic, atraumatic, oropharynx patent. EOMI, PERRLA, fundoscopic exam grossly nl.  Cardiovascular:  Regular rate and rhythm, no rubs murmurs or gallops.  No Carotid  bruits, radial pulse intact.  Respiratory: Clear to auscultation bilaterally.  No wheezes, rales, or rhonchi.  No cyanosis, no use of accessory musculature GI: No organomegaly, abdomen is soft and non-tender, positive bowel sounds.  No masses. Skin: No rashes. Neurologic: Facial musculature symmetric. Microfilament exam normal Psychiatric: Patient is appropriate throughout our interaction. Lymphatic: No cervical lymphadenopathy Musculoskeletal: Gait intact. + no ulcer on feet +minimal/trace swelling Jonhatan Hearty and ankles + tenderness bilateral heels and achilles tendon She has full ROM, 5/5 strength, sensation intact + DP, no venous stasis   LABS: Results for orders placed in visit on 01/10/13  COMPREHENSIVE METABOLIC PANEL      Result Value Range   Sodium 137  135 - 145 mEq/L   Potassium 4.3  3.5 - 5.3 mEq/L   Chloride 97  96 - 112 mEq/L   CO2 30  19 - 32 mEq/L   Glucose, Bld 306 (*) 70 - 99 mg/dL   BUN 22  6 - 23 mg/dL   Creat 2.13  0.86 - 5.78 mg/dL   Total Bilirubin 0.4  0.3 - 1.2 mg/dL   Alkaline Phosphatase 117  39 - 117 U/L   AST 9  0 - 37 U/L   ALT 10  0 - 35 U/L   Total Protein 7.3  6.0 - 8.3 g/dL   Albumin 3.8  3.5 - 5.2 g/dL   Calcium 9.2  8.4 - 46.9 mg/dL  POCT GLYCOSYLATED HEMOGLOBIN (HGB A1C)      Result Value Range   Hemoglobin A1C 8.4       EKG/XRAY:   Primary read interpreted by Dr. Conley Rolls at [facility].   ASSESSMENT/PLAN: Encounter Diagnoses  Name Primary?  . Diabetes Yes  . HTN (hypertension)   . Pain in joint, ankle and foot, unspecified laterality   . Pain in joint, lower leg, unspecified laterality   . Numbness and tingling of both legs    HbA1c improved from 10/2012 visit from 9.6 to 8.4 today Advise that she should take metformin as rx 1 tab in the Am, 2 tabs in the PM. She has only been taking 1 tab BID. She states she does not have enough pills for that. I have refilled her metformin. Trying to exxercise more but hard to lose weight and exercise with foot  pain.   HTN not well controlled. Will add metoprolol 50 mg daily. BP goal is less than 130/80. I will refer her to get a sleep study to rule out OSA. She has risk factors for it: Diurnal fatigue, wakes up multiple times during the night but unsure if she is gasping for air, she has HT poorly controlled.   Foot pain-she wants referral to both orthopedist for her achilles and heel pain , and also to podiatrist for  diabetic feet  Check CMP Refer to podiatrist-diabetic feet Refer to ortho-foot specialist Refer to sleep study-rule out OSA F/u in 1 week for HTN control on new meds, get a BP cuff. Will assess going back to work    Christon Parada, [employer], DO 01/11/2013 5:00 PM

## 2013-01-11 LAB — COMPREHENSIVE METABOLIC PANEL
ALT: 10 U/L (ref 0–35)
AST: 9 U/L (ref 0–37)
Alkaline Phosphatase: 117 U/L (ref 39–117)
CO2: 30 mEq/L (ref 19–32)
Creat: 0.78 mg/dL (ref 0.50–1.10)
Sodium: 137 mEq/L (ref 135–145)
Total Bilirubin: 0.4 mg/dL (ref 0.3–1.2)

## 2013-01-11 LAB — COMPREHENSIVE METABOLIC PANEL WITH GFR
Albumin: 3.8 g/dL (ref 3.5–5.2)
BUN: 22 mg/dL (ref 6–23)
Calcium: 9.2 mg/dL (ref 8.4–10.5)
Chloride: 97 meq/L (ref 96–112)
Glucose, Bld: 306 mg/dL — ABNORMAL HIGH (ref 70–99)
Potassium: 4.3 meq/L (ref 3.5–5.3)
Total Protein: 7.3 g/dL (ref 6.0–8.3)

## 2013-01-16 ENCOUNTER — Encounter: Payer: Self-pay | Admitting: Radiology

## 2013-01-17 ENCOUNTER — Ambulatory Visit (INDEPENDENT_AMBULATORY_CARE_PROVIDER_SITE_OTHER): Payer: Federal, State, Local not specified - PPO | Admitting: Family Medicine

## 2013-01-17 VITALS — BP 137/83 | HR 94 | Temp 97.9°F | Resp 18 | Ht 64.5 in | Wt 262.0 lb

## 2013-01-17 DIAGNOSIS — I10 Essential (primary) hypertension: Secondary | ICD-10-CM

## 2013-01-17 NOTE — Progress Notes (Signed)
[facility]:  Office Visit  Chief Complaint:  Chief Complaint  Patient presents with  . Follow-up    hypertension and diabetes    HPI: Sarah Barron is a 57 y.o. female who complains of  Here for BP follow-up. Her BP is much improved. She feels overall better since we started her on a low dose metoprolol. Denies any SEs. She is doing better and she states that her dizziness is improved but frontal HA, about the same. The headache is not better. HA In between eyes, not the worst HA of her life, dull ache. She has had HAs like this before but does not always last this long. Denies photophobia/phonophobia. Sleep pattern is off. She has not been off at work so is not sleeping very well. She has not tried any NSAIDs for this. Todays appt is really just for HTN controll and to see if she has been referred to ortho, podiatry and sleep center for rule out of OSA.    Past Medical History  Diagnosis Date  . Allergy   . Asthma   . Hypertension   . Diabetes mellitus without complication   . Arthritis    Past Surgical History  Procedure Laterality Date  . Abdominal hysterectomy    . Tubal ligation     History   Social History  . Marital Status: Single    Spouse Name: N/A    Number of Children: N/A  . Years of Education: N/A   Social History Main Topics  . Smoking status: Never Smoker   . Smokeless tobacco: None  . Alcohol Use: No  . Drug Use: No  . Sexually Active: No   Other Topics Concern  . None   Social History Narrative  . None   History reviewed. No pertinent family history. No Known Allergies Prior to Admission medications   Medication Sig Start Date End Date Taking? Authorizing Provider  albuterol (PROVENTIL HFA;VENTOLIN HFA) 108 (90 BASE) MCG/ACT inhaler Inhale 2 puffs into the lungs every 4 (four) hours as needed for wheezing. 10/20/12 10/20/13 Yes Collene Gobble, MD  beclomethasone (QVAR) 80 MCG/ACT inhaler 2 puffs twice a day followed by  rinsing and gargling with water 10/20/12  Yes Collene Gobble, MD  budesonide-formoterol [facility]) 160-4.5 MCG/ACT inhaler Inhale 2 puffs into the lungs 2 (two) times daily. 04/29/12 04/29/13 Yes Collene Gobble, MD  gabapentin (NEURONTIN) 100 MG capsule Take 3 pills PO at bedtime 12/14/12  Yes Yamaira Spinner P Reyonna Haack, DO  lisinopril-hydrochlorothiazide (PRINZIDE,ZESTORETIC) 20-25 MG per tablet Take 2 tablets by mouth daily. 10/20/12  Yes Collene Gobble, MD  meloxicam (MOBIC) 7.5 MG tablet Take 1 tablet (7.5 mg total) by mouth daily. 11/20/12  Yes Shirel Mallis P Haydn Hutsell, DO  metFORMIN (GLUCOPHAGE-XR) 500 MG 24 hr tablet Take 1 tablet in the morning and 2 tablets at night 01/10/13  Yes Mataeo Ingwersen P Tais Koestner, DO  metoprolol succinate (TOPROL-XL) 50 MG 24 hr tablet Take 1 tablet (50 mg total) by mouth daily. Take with or immediately following a meal. 01/10/13  Yes Hilding Quintanar P Shantoya Geurts, DO  triamcinolone cream (KENALOG) 0.1 % Apply topically 2 (two) times daily. 03/01/12 03/01/13 Yes Gwenlyn Found Copland, MD     ROS: The patient denies fevers, chills, night sweats, unintentional weight loss, chest pain, palpitations, wheezing, dyspnea on exertion, nausea, vomiting, abdominal pain, dysuria, hematuria, melena, numbness, weakness, or tingling.   All other systems have been reviewed and were otherwise negative with the exception of those mentioned  in the HPI and as above.    PHYSICAL EXAM: Filed Vitals:   01/17/13 1909  BP: 137/83  Pulse: 94  Temp: 97.9 F (36.6 C)  Resp: 18   Filed Vitals:   01/17/13 1909  Height: 5' 4.5" (1.638 m)  Weight: 262 lb (118.842 kg)   Body mass index is 44.29 kg/(m^2).  General: Alert, no acute distress HEENT:  Normocephalic, atraumatic, oropharynx patent. EOMI, PERRLA Cardiovascular:  Regular rate and rhythm, no rubs murmurs or gallops.  No Carotid bruits, radial pulse intact. No pedal edema.  Respiratory: Clear to auscultation bilaterally.  No wheezes, rales, or rhonchi.  No cyanosis, no use of accessory musculature GI: No  organomegaly, abdomen is soft and non-tender, positive bowel sounds.  No masses. Skin: No rashes. Neurologic: Facial musculature symmetric. Psychiatric: Patient is appropriate throughout our interaction. Lymphatic: No cervical lymphadenopathy Musculoskeletal: Gait intact but slight limp on right due to heel pain.   LABS: Results for orders placed in visit on 01/10/13  COMPREHENSIVE METABOLIC PANEL      Result Value Range   Sodium 137  135 - 145 mEq/L   Potassium 4.3  3.5 - 5.3 mEq/L   Chloride 97  96 - 112 mEq/L   CO2 30  19 - 32 mEq/L   Glucose, Bld 306 (*) 70 - 99 mg/dL   BUN 22  6 - 23 mg/dL   Creat 1.61  0.96 - 0.45 mg/dL   Total Bilirubin 0.4  0.3 - 1.2 mg/dL   Alkaline Phosphatase 117  39 - 117 U/L   AST 9  0 - 37 U/L   ALT 10  0 - 35 U/L   Total Protein 7.3  6.0 - 8.3 g/dL   Albumin 3.8  3.5 - 5.2 g/dL   Calcium 9.2  8.4 - 40.9 mg/dL  POCT GLYCOSYLATED HEMOGLOBIN (HGB A1C)      Result Value Range   Hemoglobin A1C 8.4       EKG/XRAY:   Primary read interpreted by Dr. Conley Rolls at [facility].   ASSESSMENT/PLAN: Encounter Diagnosis  Name Primary?  . HTN (hypertension) Yes   Blood pressure much improved on an addition of metoprolol 50 mg daily  She is also taking Zestoretic 20/25 mg daily We  Will try a trial of her returning to work for 3 weeks and see how her feet do She has been referred to ortho and podiatry for heel spurs, achilles pain ( ortho) and also diabetic feet ( podiatry) respectively We are awaiting for her to get sleep study to see if she has OSA ( she has the body habitus, sxs for it) , I am hoping this may help with her HA and also sleep issues. She will need to return in 3 weeks since that is how long we did the work trial for, if she can't tolerate being on her feet then we will need to adjust her work note Otherwise F/u in 3 months for Diabetes check   Champ Keetch PHUONG, DO 01/19/2013 7:21 AM

## 2013-01-19 DIAGNOSIS — I10 Essential (primary) hypertension: Secondary | ICD-10-CM | POA: Insufficient documentation

## 2013-02-09 ENCOUNTER — Ambulatory Visit (INDEPENDENT_AMBULATORY_CARE_PROVIDER_SITE_OTHER): Payer: Federal, State, Local not specified - PPO | Admitting: Family Medicine

## 2013-02-09 VITALS — BP 140/80 | HR 91 | Temp 97.9°F | Resp 18 | Ht 65.0 in | Wt 266.0 lb

## 2013-02-09 DIAGNOSIS — J309 Allergic rhinitis, unspecified: Secondary | ICD-10-CM

## 2013-02-09 DIAGNOSIS — M549 Dorsalgia, unspecified: Secondary | ICD-10-CM

## 2013-02-09 DIAGNOSIS — M25579 Pain in unspecified ankle and joints of unspecified foot: Secondary | ICD-10-CM

## 2013-02-09 MED ORDER — FLUTICASONE PROPIONATE 50 MCG/ACT NA SUSP: 2.0000 | Freq: Every day | NASAL | Status: DC

## 2013-02-09 NOTE — Progress Notes (Signed)
[facility]:  Office Visit  Chief Complaint:  Chief Complaint  Patient presents with  . Foot Pain    recheck, pain worsening  . Back Pain    recheck,pain also worsening   . URI    chest congestion also, x 1 week    HPI: Sarah Barron is a 57 y.o. female who complains of :  1. Feet is giving her problems. She has gone back to work for 6 hrs and she is barely making it through. We had taken her out of work for a period of time due to her poorly controlled DM and foot pain. Xrays show that she has significant heel spurs probably  From PF. She is going to [facility] next Wednesday. She is still waiting for ortho referral. Woul d like to get referred to podiatry. Pain was better when she was not working but worsens with prolong standing  2. Back pain off and on, NKI, worse since she has been back at work. Otherwise normal 3. URI sxs clear draiange and also has had to use albuterol 1x in 2-3 days. H/o allergies. No fevers, chills, SOB, CP  Past Medical History  Diagnosis Date  . Allergy   . Asthma   . Hypertension   . Diabetes mellitus without complication   . Arthritis    Past Surgical History  Procedure Laterality Date  . Abdominal hysterectomy    . Tubal ligation     History   Social History  . Marital Status: Single    Spouse Name: N/A    Number of Children: N/A  . Years of Education: N/A   Social History Main Topics  . Smoking status: Never Smoker   . Smokeless tobacco: None  . Alcohol Use: No  . Drug Use: No  . Sexually Active: No   Other Topics Concern  . None   Social History Narrative  . None   History reviewed. No pertinent family history. No Known Allergies Prior to Admission medications   Medication Sig Start Date End Date Taking? Authorizing Provider  albuterol (PROVENTIL HFA;VENTOLIN HFA) 108 (90 BASE) MCG/ACT inhaler Inhale 2 puffs into the lungs every 4 (four) hours as needed for wheezing. 10/20/12 10/20/13 Yes Collene Gobble, MD  beclomethasone (QVAR) 80 MCG/ACT inhaler 2 puffs twice a day followed by rinsing and gargling with water 10/20/12  Yes Collene Gobble, MD  gabapentin (NEURONTIN) 100 MG capsule Take 3 pills PO at bedtime 12/14/12  Yes Omri Bertran P Kamarian Sahakian, DO  lisinopril-hydrochlorothiazide (PRINZIDE,ZESTORETIC) 20-25 MG per tablet Take 2 tablets by mouth daily. 10/20/12  Yes Collene Gobble, MD  metFORMIN (GLUCOPHAGE-XR) 500 MG 24 hr tablet Take 1 tablet in the morning and 2 tablets at night 01/10/13  Yes Mj Willis P Emilo Gras, DO  budesonide-formoterol (SYMBICORT) 160-4.5 MCG/ACT inhaler Inhale 2 puffs into the lungs 2 (two) times daily. 04/29/12 04/29/13  Collene Gobble, MD  meloxicam (MOBIC) 7.5 MG tablet Take 1 tablet (7.5 mg total) by mouth daily. 11/20/12   Wilmarie Sparlin P Tavares Levinson, DO  metoprolol succinate (TOPROL-XL) 50 MG 24 hr tablet Take 1 tablet (50 mg total) by mouth daily. Take with or immediately following a meal. 01/10/13   Mariateresa Batra P Ilan Kahrs, DO  triamcinolone cream (KENALOG) 0.1 % Apply topically 2 (two) times daily. 03/01/12 03/01/13  Gwenlyn Found Copland, MD     ROS: The patient denies fevers, chills, night sweats, unintentional weight loss, chest pain, palpitations, wheezing, dyspnea on exertion, nausea, vomiting,  abdominal pain, dysuria, hematuria, melena, + numbness, or tingling.   All other systems have been reviewed and were otherwise negative with the exception of those mentioned in the HPI and as above.    PHYSICAL EXAM: Filed Vitals:   02/09/13 1220  BP: 140/80  Pulse: 91  Temp: 97.9 F (36.6 C)  Resp: 18   Filed Vitals:   02/09/13 1220  Height: 5\' 5"  (1.651 m)  Weight: 266 lb (120.657 kg)   Body mass index is 44.26 kg/(m^2).  General: Alert, no acute distress HEENT:  Normocephalic, atraumatic, oropharynx patent. Tm nl, boggy nares, no exudates, no sinus tenderness Cardiovascular:  Regular rate and rhythm, no rubs murmurs or gallops.  No Carotid bruits, radial pulse intact. No pedal edema.  Respiratory: Clear to auscultation  bilaterally.  No wheezes, rales, or rhonchi.  No cyanosis, no use of accessory musculature GI: No organomegaly, abdomen is soft and non-tender, positive bowel sounds.  No masses. Skin: No rashes. Neurologic: Facial musculature symmetric. Psychiatric: Patient is appropriate throughout our interaction. Lymphatic: No cervical lymphadenopathy Musculoskeletal: Gait intact. + paramsk tenderness, left greater than right, neg straight leg, 5/5 stregnth, 2/2 DTRs   LABS: Results for orders placed in visit on 01/10/13  COMPREHENSIVE METABOLIC PANEL      Result Value Range   Sodium 137  135 - 145 mEq/L   Potassium 4.3  3.5 - 5.3 mEq/L   Chloride 97  96 - 112 mEq/L   CO2 30  19 - 32 mEq/L   Glucose, Bld 306 (*) 70 - 99 mg/dL   BUN 22  6 - 23 mg/dL   Creat 1.61  0.96 - 0.45 mg/dL   Total Bilirubin 0.4  0.3 - 1.2 mg/dL   Alkaline Phosphatase 117  39 - 117 U/L   AST 9  0 - 37 U/L   ALT 10  0 - 35 U/L   Total Protein 7.3  6.0 - 8.3 g/dL   Albumin 3.8  3.5 - 5.2 g/dL   Calcium 9.2  8.4 - 40.9 mg/dL  POCT GLYCOSYLATED HEMOGLOBIN (HGB A1C)      Result Value Range   Hemoglobin A1C 8.4       EKG/XRAY:   Primary read interpreted by Dr. Conley Rolls at [facility].   ASSESSMENT/PLAN: Encounter Diagnoses  Name Primary?  . Pain in joint, ankle and foot, unspecified laterality Yes  . Back pain   . Allergic rhinitis    She will go see podiatry and also ortho referral is pending, neurontin is helping some Back pain is due to being on concrete and obesity and arthritis, does not want anymore meds URI is most likely just realted to allergies, she states it is failry well controlled. I have rx her flonase. F/u in 1 month, continue with 6 hr work restriction    [name] PHUONG, DO 02/10/2013 10:53 AM

## 2014-06-06 ENCOUNTER — Encounter (HOSPITAL_COMMUNITY): Payer: Self-pay | Admitting: Emergency Medicine

## 2014-06-06 ENCOUNTER — Emergency Department (HOSPITAL_COMMUNITY)
Admission: EM | Admit: 2014-06-06 | Discharge: 2014-06-06 | Disposition: A | Discharge status: Home / Self Care | Payer: Self-pay | Attending: Emergency Medicine | Admitting: Emergency Medicine

## 2014-06-06 DIAGNOSIS — Y9389 Activity, other specified: Secondary | ICD-10-CM | POA: Insufficient documentation

## 2014-06-06 DIAGNOSIS — IMO0002 Reserved for concepts with insufficient information to code with codable children: Secondary | ICD-10-CM | POA: Insufficient documentation

## 2014-06-06 DIAGNOSIS — Z79899 Other long term (current) drug therapy: Secondary | ICD-10-CM | POA: Insufficient documentation

## 2014-06-06 DIAGNOSIS — I159 Secondary hypertension, unspecified: Secondary | ICD-10-CM

## 2014-06-06 DIAGNOSIS — Z8739 Personal history of other diseases of the musculoskeletal system and connective tissue: Secondary | ICD-10-CM | POA: Insufficient documentation

## 2014-06-06 DIAGNOSIS — J45909 Unspecified asthma, uncomplicated: Secondary | ICD-10-CM | POA: Insufficient documentation

## 2014-06-06 DIAGNOSIS — T192XXA Foreign body in vulva and vagina, initial encounter: Secondary | ICD-10-CM | POA: Insufficient documentation

## 2014-06-06 DIAGNOSIS — I158 Other secondary hypertension: Secondary | ICD-10-CM | POA: Insufficient documentation

## 2014-06-06 DIAGNOSIS — E119 Type 2 diabetes mellitus without complications: Secondary | ICD-10-CM | POA: Insufficient documentation

## 2014-06-06 DIAGNOSIS — Y9289 Other specified places as the place of occurrence of the external cause: Secondary | ICD-10-CM | POA: Insufficient documentation

## 2014-06-06 NOTE — ED Notes (Addendum)
Patient states she has a Special educational needs teacheroise Impressa Bladder Support stuck in her vagina. Patient states she placed the support at 1800 06/05/2014, per the directions on the box this is to remain in place up to 8 hours. Patient states this is the first time she has used this product. Patient states she went to the bathroom at 0400 or 0500 and was unable to remove it. Patient denies seeing the item in the toilet after voiding. Patient states she left the item in longer than it is designed for as she was sleeping. Patient states she has a history of HTN and is supposed to be taking medication but is not, states she changed doctors and never got the medication filled.

## 2014-06-06 NOTE — ED Provider Notes (Signed)
CSN: [id number]     Arrival date & time 06/06/14  0606 History   First MD Initiated Contact with Patient 06/06/14 [phone]     Chief Complaint  Patient presents with  . Foreign Body in Vagina    Poise Impressa Bladder Support      (Consider location/radiation/quality/duration/timing/severity/associated sxs/prior Treatment) HPI  Patient to the ER with concerns that she may have left her Poise Impressa Bladder Support in her vagina. This device is used to be inserted into the vagina and then push on the bladder wall. She put it in approx 12 hours ago. She did use the restroom a few times last night and is unsure if it came out. She has a small amount of bleeding after using her fingers to try to find the impressa. Denies discharge, fevers, or pain.  Past Medical History  Diagnosis Date  . Allergy   . Asthma   . Hypertension   . Diabetes mellitus without complication   . Arthritis    Past Surgical History  Procedure Laterality Date  . Abdominal hysterectomy    . Tubal ligation     History reviewed. No pertinent family history. History  Substance Use Topics  . Smoking status: Never Smoker   . Smokeless tobacco: Not on file  . Alcohol Use: No   OB History   Grav Para Term Preterm Abortions TAB SAB Ect Mult Living                 Review of Systems  All other systems reviewed and are negative.     Allergies  Review of patient's allergies indicates no known allergies.  Home Medications   Prior to Admission medications   Medication Sig Start Date End Date Taking? Authorizing Provider  Multiple Vitamin (MULTIVITAMIN WITH MINERALS) TABS tablet Take 1 tablet by mouth daily.   Yes Historical Provider, MD   BP 201/90  Pulse 97  Temp(Src) 98.1 F (36.7 C) (Oral)  Resp 20  Ht 5\' 4"  (1.626 m)  Wt 263 lb 4 oz (119.409 kg)  BMI 45.16 kg/m2  SpO2 98% Physical Exam  Constitutional: She appears well-developed and well-nourished.  HENT:  Head: Normocephalic and atraumatic.   Eyes: Conjunctivae are normal. Pupils are equal, round, and reactive to light.  Neck: Trachea normal, normal range of motion and full passive range of motion without pain. Neck supple.  Cardiovascular: Normal rate, regular rhythm and normal pulses.   Pulmonary/Chest: Effort normal and breath sounds normal.  Abdominal: Soft. Normal appearance and bowel sounds are normal.  Genitourinary:  Pt has small amount of bleeding in canal. No obvious signs of injury. She does not have a cervix, surgically removed. No Foreign body noted.  Musculoskeletal: Normal range of motion.  Neurological: She is alert. She has normal strength.  Skin: Skin is warm, dry and intact.  Psychiatric: She has a normal mood and affect. Thought content normal.    ED Course  Procedures (including critical care time) Labs Review Labs Reviewed - No data to display  Imaging Review No results found.   EKG Interpretation None      MDM   Final diagnoses:  Vaginal foreign body, initial encounter    No foreign body noted. No discharge or irritation/pain to vaginal wall.  58 y.o.Sarah Barron's evaluation in the Emergency Department is complete. It has been determined that no acute conditions requiring further emergency intervention are present at this time. The patient/guardian have been advised of the diagnosis and plan. We have  discussed signs and symptoms that warrant return to the ED, such as changes or worsening in symptoms.  Vital signs are stable at discharge. Filed Vitals:   06/06/14 0615  BP: 201/90  Pulse: 97  Temp: 98.1 F (36.7 C)  Resp: 20    Patient/guardian has voiced understanding and agreed to follow-up with the PCP or specialist.     Dorthula Matasiffany G Katty Fretwell, PA-C 06/06/14 [phone]

## 2014-06-06 NOTE — Discharge Instructions (Signed)
Vaginal Foreign Body °A vaginal foreign body is any object that gets stuck or left inside the vagina. Vaginal foreign bodies left in the vagina for a long time can cause irritation and infection. In most cases, symptoms go away once the vaginal foreign body is found and removed. Rarely, a foreign object can break through the walls of the vagina and cause a serious infection inside the abdomen.  °CAUSES  °The most common vaginal foreign bodies are: °· Tampons. °· Contraceptive devices. °· Toilet tissue left in the vagina. °· Small objects that were placed in the vagina out of curiosity and got stuck. °· A result of sexual abuse.  °SIGNS AND SYMPTOMS °· Light vaginal bleeding. °· Blood-tinged vaginal fluid (discharge). °· Vaginal discharge that smells bad. °· Vaginal itching or burning. °· Redness, swelling, or rash near the opening of the vagina. °· Abdominal pain. °· Fever. °· Burning or frequent urination. °DIAGNOSIS  °Your health care provider may be able to diagnose a vaginal foreign body based on the information you provide, your symptoms, and a physical exam. Your health care provider may also perform the following tests to check for infection: °· A swab of the discharge to check under a microscope for bacteria (culture). °· A urine culture. °· An examination of the vagina with a small, lighted scope (vaginoscopy). °· Imaging tests to get a picture of the inside of your vagina, such as: °¨ Ultrasound. °¨ X-ray. °¨ MRI. °TREATMENT  °In most cases, a vaginal foreign body can be easily removed and the symptoms usually go away very quickly. Other treatment may include:  °· If the vaginal foreign body is not easily removed, medicine may be given to make you go to sleep (general anesthesia) to have the object removed. °· Emergency surgery may be necessary if an infection spreads through the walls of the vagina into the abdomen (acute abdomen). This is rare. °· You may need to take antibiotic medicine if you have a  vaginal or urinary tract infection. °HOME CARE INSTRUCTIONS  °· Take medicines only as directed by your health care provider. °· If you were prescribed an antibiotic medicine, finish it all even if you start to feel better. °· Do not have sex or use tampons until your health care provider says it is okay. °· Do not douche or use vaginal rinses unless your health care provider recommends it. °· Keep all follow-up visits as directed by your health care provider. This is important. °SEEK MEDICAL CARE IF: °· You have abdominal pain or burning pain when urinating. °· You have a fever. °SEEK IMMEDIATE MEDICAL CARE IF: °· You have heavy vaginal bleeding or discharge.   °· You have very bad abdominal pain.   °MAKE SURE YOU: °· Understand these instructions. °· Will watch your condition. °· Will get help right away if you are not doing well or get worse. °Document Released: 01/09/2014 Document Reviewed: 06/24/2013 °Exit[employer]® Patient Information ©2015 [employer]. This information is not intended to replace advice given to you by your health care provider. Make sure you discuss any questions you have with your health care provider. ° °

## 2014-06-07 NOTE — ED Provider Notes (Signed)
Medical screening examination/treatment/procedure(s) were performed by non-physician practitioner and as supervising physician I was immediately available for consultation/collaboration.   EKG Interpretation None        Tata Timmins M Kylea Berrong, MD 06/07/14 0018 

## 2016-09-08 ENCOUNTER — Emergency Department (HOSPITAL_COMMUNITY): Payer: Federal, State, Local not specified - PPO

## 2016-09-08 ENCOUNTER — Encounter (HOSPITAL_COMMUNITY): Payer: Self-pay | Admitting: *Deleted

## 2016-09-08 ENCOUNTER — Emergency Department (HOSPITAL_COMMUNITY)
Admission: EM | Admit: 2016-09-08 | Discharge: 2016-09-08 | Disposition: A | Discharge status: Home / Self Care | Payer: Federal, State, Local not specified - PPO | Attending: Emergency Medicine | Admitting: Emergency Medicine

## 2016-09-08 DIAGNOSIS — R930 Abnormal findings on diagnostic imaging of skull and head, not elsewhere classified: Secondary | ICD-10-CM | POA: Diagnosis not present

## 2016-09-08 DIAGNOSIS — Z7984 Long term (current) use of oral hypoglycemic drugs: Secondary | ICD-10-CM | POA: Insufficient documentation

## 2016-09-08 DIAGNOSIS — N3000 Acute cystitis without hematuria: Secondary | ICD-10-CM

## 2016-09-08 DIAGNOSIS — Z79899 Other long term (current) drug therapy: Secondary | ICD-10-CM | POA: Diagnosis not present

## 2016-09-08 DIAGNOSIS — I10 Essential (primary) hypertension: Secondary | ICD-10-CM

## 2016-09-08 DIAGNOSIS — R739 Hyperglycemia, unspecified: Secondary | ICD-10-CM

## 2016-09-08 DIAGNOSIS — J45909 Unspecified asthma, uncomplicated: Secondary | ICD-10-CM | POA: Insufficient documentation

## 2016-09-08 DIAGNOSIS — E119 Type 2 diabetes mellitus without complications: Secondary | ICD-10-CM | POA: Diagnosis not present

## 2016-09-08 LAB — URINALYSIS, ROUTINE W REFLEX MICROSCOPIC
Bilirubin Urine: NEGATIVE
Glucose, UA: >=500 mg/dL — AB
Ketones, ur: 80 mg/dL — AB
NITRITE: POSITIVE — AB
PROTEIN: 30 mg/dL — AB
SPECIFIC GRAVITY, URINE: 1.031 — AB (ref 1.005–1.030)
pH: 5 (ref 5.0–8.0)

## 2016-09-08 LAB — CBC
HCT: 43.5 % (ref 36.0–46.0)
Hemoglobin: 14.8 g/dL (ref 12.0–15.0)
MCH: 32.1 pg (ref 26.0–34.0)
MCHC: 34 g/dL (ref 30.0–36.0)
MCV: 94.4 fL (ref 78.0–100.0)
Platelets: 301 10*3/uL (ref 150–400)
RBC: 4.61 MIL/uL (ref 3.87–5.11)
RDW: 13.1 % (ref 11.5–15.5)
WBC: 11.4 10*3/uL — ABNORMAL HIGH (ref 4.0–10.5)

## 2016-09-08 LAB — BASIC METABOLIC PANEL
Anion gap: 12 (ref 5–15)
BUN: 15 mg/dL (ref 6–20)
CO2: 24 mmol/L (ref 22–32)
Calcium: 9 mg/dL (ref 8.9–10.3)
Chloride: 103 mmol/L (ref 101–111)
Creatinine, Ser: 0.76 mg/dL (ref 0.44–1.00)
GFR calc Af Amer: >60 mL/min (ref >60)
Glucose, Bld: 393 mg/dL — ABNORMAL HIGH (ref 65–99)
Potassium: 4.3 mmol/L (ref 3.5–5.1)
Sodium: 139 mmol/L (ref 135–145)

## 2016-09-08 LAB — CBG MONITORING, ED
GLUCOSE-CAPILLARY: 354 mg/dL — AB (ref 65–99)
GLUCOSE-CAPILLARY: 361 mg/dL — AB (ref 65–99)
Glucose-Capillary: 255 mg/dL — ABNORMAL HIGH (ref 65–99)

## 2016-09-08 LAB — I-STAT TROPONIN, ED
TROPONIN I, POC: 0.05 ng/mL (ref 0.00–0.08)
Troponin i, poc: 0.05 ng/mL (ref 0.00–0.08)

## 2016-09-08 MED ORDER — HYDROCHLOROTHIAZIDE 25 MG PO TABS: 25.0000 mg | ORAL_TABLET | Freq: Every day | ORAL | 0 refills | Status: AC

## 2016-09-08 MED ORDER — INSULIN ASPART 100 UNIT/ML ~~LOC~~ SOLN
4.0000 [IU] | Freq: Once | SUBCUTANEOUS | Status: AC
  Administered 2016-09-08: 4 [IU] via SUBCUTANEOUS
  Filled 2016-09-08: qty 1

## 2016-09-08 MED ORDER — METFORMIN HCL 500 MG PO TABS: 500.0000 mg | ORAL_TABLET | Freq: Two times a day (BID) | ORAL | 0 refills | Status: AC

## 2016-09-08 MED ORDER — SODIUM CHLORIDE 0.9 % IV BOLUS (SEPSIS)
1000.0000 mL | Freq: Once | INTRAVENOUS | Status: AC
Start: 2016-09-08 — End: 2016-09-08
  Administered 2016-09-08: 1000 mL via INTRAVENOUS

## 2016-09-08 MED ORDER — LIVING WELL WITH DIABETES BOOK
Freq: Once | Status: AC
  Administered 2016-09-08: 1
  Filled 2016-09-08: qty 1

## 2016-09-08 MED ORDER — HYDROCHLOROTHIAZIDE 12.5 MG PO CAPS
25.0000 mg | ORAL_CAPSULE | Freq: Once | ORAL | Status: AC
  Administered 2016-09-08: 25 mg via ORAL
  Filled 2016-09-08: qty 2

## 2016-09-08 MED ORDER — CEPHALEXIN 500 MG PO CAPS: 500.0000 mg | ORAL_CAPSULE | Freq: Three times a day (TID) | ORAL | 0 refills | Status: AC

## 2016-09-08 MED ORDER — CEFTRIAXONE SODIUM 1 G IJ SOLR
1.0000 g | Freq: Once | INTRAMUSCULAR | Status: AC
  Administered 2016-09-08: 1 g via INTRAVENOUS
  Filled 2016-09-08: qty 10

## 2016-09-08 MED ORDER — SODIUM CHLORIDE 0.9 % IV BOLUS (SEPSIS)
1000.0000 mL | Freq: Once | INTRAVENOUS | Status: AC
  Administered 2016-09-08: 1000 mL via INTRAVENOUS

## 2016-09-08 NOTE — ED Provider Notes (Signed)
[facility]-EMERGENCY DEPT Provider Note   CSN: [id number] Arrival date & time: 09/08/16  1213     History   Chief Complaint Chief Complaint  Patient presents with  . Hypertension    HPI Sarah Barron is a 61 y.o. female.  HPI   Pt with known but untreated HTN, HLD, DM sent from [facility] urgent care for hypertension.  States she was previously on medications for her underlying medical problems but states they made her feel bad so she stopped taking them.  Went to [facility] today because she was having numbness and tingling in her right leg without pain or weakness.  She reports she twisted the leg while exercising about 3 days ago.  [facility] noted her blood pressure and sent her to ED.    Pt does have urinary frequency.  Also states that while she was in the ED she developed a few minutes of central chest pain without associated symptoms that spontaneously resolved.  She did tell [facility] that she has chronic sinus problems and occasional headaches but denies headache currently.  Denies fevers, SOB, cough, abdominal pain, N/V, bowel changes, leg swelling.     Past Medical History:  Diagnosis Date  . Allergy   . Arthritis   . Asthma   . Diabetes mellitus without complication (HCC)   . Hypertension     Patient Active Problem List   Diagnosis Date Noted  . HTN (hypertension) 01/19/2013  . Rash, skin 06/30/2012  . Obesity 03/01/2012  . Diabetes mellitus type II 03/01/2012    Past Surgical History:  Procedure Laterality Date  . ABDOMINAL HYSTERECTOMY    . TUBAL LIGATION      OB History    No data available       Home Medications    Prior to Admission medications   Medication Sig Start Date End Date Taking? Authorizing Provider  Multiple Vitamin (MULTIVITAMIN WITH MINERALS) TABS tablet Take 1 tablet by mouth daily.   Yes Historical Provider, MD  OVER THE COUNTER MEDICATION Take 1 tablet by mouth daily. Several Herbal Supplements- Acenaisia, and others but pt Is unable  to recall all the names of supplements..   Yes Historical Provider, MD  cephALEXin (KEFLEX) 500 MG capsule Take 1 capsule (500 mg total) by mouth 3 (three) times daily. 09/08/16   Trixie DredgeEmily Detra Bores, PA-C  hydrochlorothiazide (HYDRODIURIL) 25 MG tablet Take 1 tablet (25 mg total) by mouth daily. 09/08/16   Trixie DredgeEmily Chandy Tarman, PA-C  metFORMIN (GLUCOPHAGE) 500 MG tablet Take 1 tablet (500 mg total) by mouth 2 (two) times daily with a meal. 09/08/16   Trixie DredgeEmily Nakoma Gotwalt, PA-C    Family History No family history on file.  Social History Social History  Substance Use Topics  . Smoking status: Never Smoker  . Smokeless tobacco: Not on file  . Alcohol use No     Allergies   Patient has no known allergies.   Review of Systems Review of Systems  All other systems reviewed and are negative.    Physical Exam Updated Vital Signs BP 186/93 (BP Location: Left Arm)   Pulse 87   Temp 98.4 F (36.9 C) (Oral)   Resp 19   Ht 5\' 4"  (1.626 m)   Wt 116.8 kg   SpO2 99%   BMI 44.22 kg/m   Physical Exam  Constitutional: She appears well-developed and well-nourished. No distress.  HENT:  Head: Normocephalic and atraumatic.  Neck: Neck supple.  Cardiovascular: Normal rate and regular rhythm.  Pulmonary/Chest: Effort normal and breath sounds normal. No respiratory distress. She has no wheezes. She has no rales.  Abdominal: Soft. She exhibits no distension. There is no tenderness. There is no rebound and no guarding.  Musculoskeletal: She exhibits no edema.  Neurological: She is alert.  Skin: She is not diaphoretic.  Nursing note and vitals reviewed.    ED Treatments / Results  Labs (all labs ordered are listed, but only abnormal results are displayed) Labs Reviewed  BASIC METABOLIC PANEL - Abnormal; Notable for the following:       Result Value   Glucose, Bld 393 (*)    All other components within normal limits  CBC - Abnormal; Notable for the following:    WBC 11.4 (*)    All other components within normal  limits  URINALYSIS, ROUTINE W REFLEX MICROSCOPIC - Abnormal; Notable for the following:    Specific Gravity, Urine 1.031 (*)    Glucose, UA >=500 (*)    Hgb urine dipstick MODERATE (*)    Ketones, ur 80 (*)    Protein, ur 30 (*)    Nitrite POSITIVE (*)    Leukocytes, UA SMALL (*)    Bacteria, UA RARE (*)    Squamous Epithelial / LPF 0-5 (*)    All other components within normal limits  CBG MONITORING, ED - Abnormal; Notable for the following:    Glucose-Capillary 354 (*)    All other components within normal limits  CBG MONITORING, ED - Abnormal; Notable for the following:    Glucose-Capillary 361 (*)    All other components within normal limits  CBG MONITORING, ED - Abnormal; Notable for the following:    Glucose-Capillary 255 (*)    All other components within normal limits  URINE CULTURE  I-STAT TROPOININ, ED  I-STAT TROPOININ, ED    EKG  EKG Interpretation None       Radiology Dg Chest 2 View  Result Date: 09/08/2016 CLINICAL DATA:  Patient with chest pain and discomfort. EXAM: CHEST  2 VIEW COMPARISON:  None. FINDINGS: Monitoring leads overlie the patient. Normal cardiac mediastinal contours. No consolidative pulmonary opacities. No pleural effusion or pneumothorax. Thoracic spine degenerative changes. IMPRESSION: No active cardiopulmonary disease. Electronically Signed   By: Annia Belt M.D.   On: 09/08/2016 16:02   Ct Head Wo Contrast  Result Date: 09/08/2016 CLINICAL DATA:  Acute right lower extremity weakness. EXAM: CT HEAD WITHOUT CONTRAST TECHNIQUE: Contiguous axial images were obtained from the base of the skull through the vertex without intravenous contrast. COMPARISON:  None. FINDINGS: Brain: No mass effect or midline shift is noted. Ventricular size is within normal limits. There is no evidence hemorrhage or acute infarction. Probable small calcified right frontal meningioma is noted. Vascular: No hyperdense vessel or unexpected calcification. Skull: Normal.  Negative for fracture or focal lesion. Sinuses/Orbits: No acute finding. Other: None. IMPRESSION: Probable small calcified right frontal meningioma. No acute intracranial abnormality seen. Electronically Signed   By: Lupita Raider, M.D.   On: 09/08/2016 16:46    Procedures Procedures (including critical care time)  Medications Ordered in ED Medications  sodium chloride 0.9 % bolus 1,000 mL (0 mLs Intravenous Stopped 09/08/16 1645)  cefTRIAXone (ROCEPHIN) 1 g in dextrose 5 % 50 mL IVPB (0 g Intravenous Stopped 09/08/16 1526)  hydrochlorothiazide (MICROZIDE) capsule 25 mg (25 mg Oral Given 09/08/16 1457)  living well with diabetes book MISC (1 each Does not apply Given 09/08/16 1615)  insulin aspart (novoLOG) injection 4 Units (4  Units Subcutaneous Given 09/08/16 1645)  sodium chloride 0.9 % bolus 1,000 mL (0 mLs Intravenous Stopped 09/08/16 1754)     Initial Impression / Assessment and Plan / ED Course  I have reviewed the triage vital signs and the nursing notes.  Pertinent labs & imaging results that were available during my care of the patient were reviewed by me and considered in my medical decision making (see chart for details).  Clinical Course     Afebrile, nontoxic patient with known but untreated HTN, HLD, DM.  Labs significant for hyperglycemia, improved with IVF and medications in ED.  Her blood pressure also improved with PO HCTZ.  Pt without headache, SOB, AMS.  She had chest pain at rest while in ED that felt like indigestion - very atypical, doubt ACS.  Delta troponin x 2 negative.  EKG nonischemic.  CXR negative.  Pt also with right leg tingling, mild pain after twisting it while exercising - suspect mild radiculopathy.  CT head negative for acute finding - there is meningioma that I have made pt aware of and she will follow with PCP to have this monitored.   UA appears infected and pt is having frequency.  Rocephin given in ED.  Kelfex for home.  Culture pending.  D/C home with HCTZ,  metformin, keflex.  Engaged in extensive discussion with patient and her son regarding her chronic medical issues and their management.  Pt does not like taking medications but agrees to try the ones prescribed.  She has health insurance and we discussed primary care and importance of close follow up.  We discussed the extreme risks involved with leaving these medical problems untreated.  Also counseled on diabetic diet.  Discussed result, findings, treatment, and follow up  with patient.  Pt given return precautions.  Pt verbalizes understanding and agrees with plan.       Final Clinical Impressions(s) / ED Diagnoses   Final diagnoses:  Hypertension, unspecified type  Hyperglycemia  Acute cystitis without hematuria    New Prescriptions Discharge Medication List as of 09/08/2016  7:42 PM    START taking these medications   Details  cephALEXin (KEFLEX) 500 MG capsule Take 1 capsule (500 mg total) by mouth 3 (three) times daily., Starting Mon 09/08/2016, Print    hydrochlorothiazide (HYDRODIURIL) 25 MG tablet Take 1 tablet (25 mg total) by mouth daily., Starting Mon 09/08/2016, Print    metFORMIN (GLUCOPHAGE) 500 MG tablet Take 1 tablet (500 mg total) by mouth 2 (two) times daily with a meal., Starting Mon 09/08/2016, Print         [place], [place] 09/08/16 2117    Marily Memos, MD 09/09/16 1152

## 2016-09-08 NOTE — Discharge Instructions (Signed)
Read the information below.  Use the prescribed medication as directed.  Please discuss all new medications with your pharmacist.  You may return to the Emergency Department at any time for worsening condition or any new symptoms that concern you.  If you develop worsening chest pain, shortness of breath, fever, you pass out, or become weak or dizzy, return to the ER for a recheck.   If you develop worsening numbness or new weakness in your arms or legs, have difficulty walking or speaking, call 911 or return to the ED immediately for a recheck.   Have your blood pressure and blood sugar rechecked within the week.

## 2016-09-08 NOTE — ED Notes (Signed)
Pt to be placed in room next available.

## 2016-09-08 NOTE — ED Notes (Signed)
Jake Talkington verbalizes will attempt US guided IV.

## 2016-09-08 NOTE — ED Notes (Signed)
Two unsuccessful IV attempts by this writer.  

## 2016-09-08 NOTE — ED Notes (Signed)
Delay in administering medications related to US guided IV in process.

## 2016-09-08 NOTE — ED Triage Notes (Signed)
Pt was seen at [facility] this AM and was instructed to seek further evalutaiton her for High BP. Pt presents w/ BP of 222/104 here. Pt reports 3/10 HA pain. Pt states she has not been taking her BP medication for 2 years. Pt ambulatory to triage, speaking in complete sentences.

## 2016-09-10 ENCOUNTER — Encounter (HOSPITAL_COMMUNITY): Payer: Self-pay

## 2016-09-10 ENCOUNTER — Emergency Department (HOSPITAL_COMMUNITY)
Admission: EM | Admit: 2016-09-10 | Discharge: 2016-09-10 | Disposition: A | Discharge status: Home / Self Care | Payer: Federal, State, Local not specified - PPO | Attending: Emergency Medicine | Admitting: Emergency Medicine

## 2016-09-10 ENCOUNTER — Emergency Department (HOSPITAL_COMMUNITY): Payer: Federal, State, Local not specified - PPO

## 2016-09-10 DIAGNOSIS — Z7984 Long term (current) use of oral hypoglycemic drugs: Secondary | ICD-10-CM | POA: Insufficient documentation

## 2016-09-10 DIAGNOSIS — J45909 Unspecified asthma, uncomplicated: Secondary | ICD-10-CM | POA: Diagnosis not present

## 2016-09-10 DIAGNOSIS — E119 Type 2 diabetes mellitus without complications: Secondary | ICD-10-CM | POA: Insufficient documentation

## 2016-09-10 DIAGNOSIS — I10 Essential (primary) hypertension: Secondary | ICD-10-CM

## 2016-09-10 DIAGNOSIS — R202 Paresthesia of skin: Secondary | ICD-10-CM | POA: Diagnosis present

## 2016-09-10 DIAGNOSIS — M5416 Radiculopathy, lumbar region: Secondary | ICD-10-CM | POA: Diagnosis not present

## 2016-09-10 DIAGNOSIS — R200 Anesthesia of skin: Secondary | ICD-10-CM

## 2016-09-10 LAB — CBC WITH DIFFERENTIAL/PLATELET
BASOS ABS: 0 10*3/uL (ref 0.0–0.1)
BASOS PCT: 0 %
EOS ABS: 0 10*3/uL (ref 0.0–0.7)
Eosinophils Relative: 0 %
HCT: 40.9 % (ref 36.0–46.0)
Hemoglobin: 13.7 g/dL (ref 12.0–15.0)
Lymphocytes Relative: 20 %
Lymphs Abs: 1.7 10*3/uL (ref 0.7–4.0)
MCH: 31.3 pg (ref 26.0–34.0)
MCHC: 33.5 g/dL (ref 30.0–36.0)
MCV: 93.4 fL (ref 78.0–100.0)
MONO ABS: 0.4 10*3/uL (ref 0.1–1.0)
MONOS PCT: 5 %
Neutro Abs: 6.5 10*3/uL (ref 1.7–7.7)
Neutrophils Relative %: 75 %
PLATELETS: 279 10*3/uL (ref 150–400)
RBC: 4.38 MIL/uL (ref 3.87–5.11)
RDW: 12.9 % (ref 11.5–15.5)
WBC: 8.7 10*3/uL (ref 4.0–10.5)

## 2016-09-10 LAB — BASIC METABOLIC PANEL
ANION GAP: 13 (ref 5–15)
BUN: 13 mg/dL (ref 6–20)
CALCIUM: 9.1 mg/dL (ref 8.9–10.3)
CO2: 24 mmol/L (ref 22–32)
CREATININE: 0.64 mg/dL (ref 0.44–1.00)
Chloride: 96 mmol/L — ABNORMAL LOW (ref 101–111)
Glucose, Bld: 318 mg/dL — ABNORMAL HIGH (ref 65–99)
Potassium: 4.2 mmol/L (ref 3.5–5.1)
SODIUM: 133 mmol/L — AB (ref 135–145)

## 2016-09-10 LAB — TROPONIN I

## 2016-09-10 MED ORDER — CLONIDINE HCL 0.1 MG PO TABS
0.1000 mg | ORAL_TABLET | Freq: Once | ORAL | Status: AC
  Administered 2016-09-10: 0.1 mg via ORAL
  Filled 2016-09-10: qty 1

## 2016-09-10 MED ORDER — HYDROCODONE-ACETAMINOPHEN 5-325 MG PO TABS: 1.0000 | ORAL_TABLET | ORAL | 0 refills | Status: AC | PRN

## 2016-09-10 MED ORDER — LABETALOL HCL 5 MG/ML IV SOLN: 20.0000 mg | Freq: Once | INTRAVENOUS | Status: DC

## 2016-09-10 MED ORDER — HYDROCODONE-ACETAMINOPHEN 5-325 MG PO TABS
2.0000 | ORAL_TABLET | Freq: Once | ORAL | Status: AC
  Administered 2016-09-10: 2 via ORAL
  Filled 2016-09-10: qty 2

## 2016-09-10 MED ORDER — CLONIDINE HCL 0.1 MG PO TABS: ORAL_TABLET | ORAL | 11 refills | Status: AC

## 2016-09-10 MED ORDER — HYDROCHLOROTHIAZIDE 12.5 MG PO CAPS
25.0000 mg | ORAL_CAPSULE | Freq: Once | ORAL | Status: AC
  Administered 2016-09-10: 25 mg via ORAL
  Filled 2016-09-10: qty 2

## 2016-09-10 MED ORDER — AMLODIPINE BESYLATE 10 MG PO TABS: 10.0000 mg | ORAL_TABLET | Freq: Every day | ORAL | 1 refills | Status: AC

## 2016-09-10 MED ORDER — AMLODIPINE BESYLATE 5 MG PO TABS
10.0000 mg | ORAL_TABLET | Freq: Once | ORAL | Status: AC
  Administered 2016-09-10: 10 mg via ORAL
  Filled 2016-09-10: qty 2

## 2016-09-10 NOTE — ED Notes (Signed)
Patient transported to MRI 

## 2016-09-10 NOTE — ED Notes (Signed)
Dr. Jacqulyn BathLong attempted US IV insertion x 2.

## 2016-09-10 NOTE — ED Notes (Signed)
Patient transported to CT 

## 2016-09-10 NOTE — ED Provider Notes (Signed)
[facility]-EMERG[facility] DEPT Provider Note   CSN: [id number] Arrival date & time: 10/08/2016  0902     History   Chief Complaint Chief Complaint  Patient presents with  . Hand Tingling  . Leg Pain    HPI Sarah Barron is a 61 y.o. female.  HPI Patient reports that she has right leg and foot tingling with some feeling of numbness to the right foot. This has been ongoing for approximately 5 days. This was evaluated and thought to be secondary to a strain during exercising. The patient reports however she's had ongoing tingling in the right hand as well. She does note that this morning it seemed kind of difficult to write with her right hand. She describes a symptom in her right upper extremity is coming and going but initiating at the same time as the right leg problem. She denies headache or visual problem. Patient reports that she took her antihypertensive medications this morning. She reports some time she's getting slight chest discomfort at the top of her chest in the center. That comes and goes. She denies she's had fever or chills. No productive cough. No abdominal pain or vomiting. Past Medical History:  Diagnosis Date  . Allergy   . Arthritis   . Asthma   . Diabetes mellitus without complication (HCC)   . Hypertension     Patient Active Problem List   Diagnosis Date Noted  . HTN (hypertension) 01/19/2013  . Rash, skin 06/30/2012  . Obesity 03/01/2012  . Diabetes mellitus type II 03/01/2012    Past Surgical History:  Procedure Laterality Date  . ABDOMINAL HYSTERECTOMY    . TUBAL LIGATION      OB History    No data available       Home Medications    Prior to Admission medications   Medication Sig Start Date End Date Taking? Authorizing Provider  Alpha-Lipoic Acid 300 MG TABS Take 300 mg by mouth 2 (two) times daily.   Yes Historical Provider, MD  cephALEXin (KEFLEX) 500 MG capsule Take 1 capsule (500 mg total) by mouth 3 (three) times daily. 09/08/16  Yes Trixie DredgeEmily [place],  PA-C  CINNAMON PO Take 250 mg by mouth daily.   Yes Historical Provider, MD  Gymnema Sylvestris Leaf POWD Take 400 mg by mouth 2 (two) times daily with a meal.   Yes Historical Provider, MD  hydrochlorothiazide (HYDRODIURIL) 25 MG tablet Take 1 tablet (25 mg total) by mouth daily. 09/08/16  Yes Trixie DredgeEmily West, PA-C  metFORMIN (GLUCOPHAGE) 500 MG tablet Take 1 tablet (500 mg total) by mouth 2 (two) times daily with a meal. 09/08/16  Yes Trixie DredgeEmily West, PA-C  Multiple Vitamin (MULTIVITAMIN WITH MINERALS) TABS tablet Take 1 tablet by mouth daily.   Yes Historical Provider, MD  OVER THE COUNTER MEDICATION Take 1 tablet by mouth daily. Several Herbal Supplements- Acenaisia, and others but pt Is unable to recall all the names of supplements..   Yes Historical Provider, MD  OVER THE COUNTER MEDICATION Take 1 capsule by mouth 2 (two) times daily. Swanson Ultra Muscular Comfort Formula  Cherry PURE 250mg  Tumeric 50 g Boswellia serata extract 25 White willow bark extract 25mg  Bromelain 12.5 mg   Yes Historical Provider, MD    Family History History reviewed. No pertinent family history.  Social History Social History  Substance Use Topics  . Smoking status: Never Smoker  . Smokeless tobacco: Never Used  . Alcohol use No     Allergies   Patient has no known  allergies.   Review of Systems Review of Systems 10 Systems reviewed and are negative for acute change except as noted in the HPI.  Physical Exam Updated Vital Signs BP (!) 214/82 (BP Location: Left Arm)   Pulse 102   Temp 98.4 F (36.9 C) (Oral)   Resp 16   Ht 5\' 4"  (1.626 m)   Wt 257 lb (116.6 kg)   SpO2 97%   BMI 44.11 kg/m   Physical Exam  Constitutional: She is oriented to person, place, and time. She appears well-developed and well-nourished. No distress.  HENT:  Head: Normocephalic and atraumatic.  Eyes: Conjunctivae are normal.  Neck: Neck supple.  Cardiovascular: Normal rate and regular rhythm.   No murmur  heard. Pulmonary/Chest: Effort normal and breath sounds normal. No respiratory distress.  Abdominal: Soft. There is no tenderness.  Musculoskeletal: She exhibits no edema.  Neurological: She is alert and oriented to person, place, and time. No cranial nerve deficit. She exhibits normal muscle tone. Coordination normal.  Patient is able to sit stand and ambulate without gait dysfunction.  Skin: Skin is warm and dry.  Psychiatric: She has a normal mood and affect.  Nursing note and vitals reviewed.    ED Treatments / Results  Labs (all labs ordered are listed, but only abnormal results are displayed) Labs Reviewed  BASIC METABOLIC PANEL  TROPONIN I  CBC WITH DIFFERENTIAL/PLATELET    EKG  EKG Interpretation None       Radiology Dg Chest 2 View  Result Date: 09/08/2016 CLINICAL DATA:  Patient with chest pain and discomfort. EXAM: CHEST  2 VIEW COMPARISON:  None. FINDINGS: Monitoring leads overlie the patient. Normal cardiac mediastinal contours. No consolidative pulmonary opacities. No pleural effusion or pneumothorax. Thoracic spine degenerative changes. IMPRESSION: No active cardiopulmonary disease. Electronically Signed   By: Annia Belt M.D.   On: 09/08/2016 16:02   Ct Head Wo Contrast  Result Date: 09/08/2016 CLINICAL DATA:  Acute right lower extremity weakness. EXAM: CT HEAD WITHOUT CONTRAST TECHNIQUE: Contiguous axial images were obtained from the base of the skull through the vertex without intravenous contrast. COMPARISON:  None. FINDINGS: Brain: No mass effect or midline shift is noted. Ventricular size is within normal limits. There is no evidence hemorrhage or acute infarction. Probable small calcified right frontal meningioma is noted. Vascular: No hyperdense vessel or unexpected calcification. Skull: Normal. Negative for fracture or focal lesion. Sinuses/Orbits: No acute finding. Other: None. IMPRESSION: Probable small calcified right frontal meningioma. No acute  intracranial abnormality seen. Electronically Signed   By: Lupita Raider, M.D.   On: 09/08/2016 16:46    Procedures Procedures (including critical care time)  Medications Ordered in ED Medications - No data to display   Initial Impression / Assessment and Plan / ED Course  I have reviewed the triage vital signs and the nursing notes.  Pertinent labs & imaging results that were available during my care of the patient were reviewed by me and considered in my medical decision making (see chart for details).  Clinical Course   Due to my order entry error, the  patient erroneously had CT head before her MRI. I did not intend to repeat CT head and have requested Charge to forward to billing to remove charge.   Final Clinical Impressions(s) / ED Diagnoses   Final diagnoses:  None  Patient presents with right lower extremity paresthesia and some numbness of very consistent with a radiculopathy. She however has associated right upper extremity complaint of numbness  and paresthesia. At this time with second visit I felt it was necessary to rule out for CVA with unilateral symptoms in both upper and lower extremity. Patient has had untreated hypertension for several years. She does not have headache or blurred vision. She just recently was given a prescription for hydrochlorothiazide but has not been on any other blood pressure medications for over 2 years. Will initiate Norvasc and have the patient continue the hydrochlorothiazide. Patient will require close follow-up for blood pressure monitoring. She today has established care with primary care and will have follow-up. Patient was given Vicodin for pain control with pain that suggestive of radiculopathy. This was helpful.  New Prescriptions New Prescriptions   No medications on file     Arby Barrette, MD 09/21/16 1730

## 2016-09-10 NOTE — ED Notes (Signed)
Lab Phlebotomist called for recollect of BMP.

## 2016-09-10 NOTE — ED Triage Notes (Signed)
Pt c/o pain radiating from R hip to foot x 5 days and intermittent R hand tingling x 2 days.  Pain score 8/10.  Denies injury.  Pt reports the pain in leg "is like a cramp."  Pt unclear during assessment and seems to be a poor historian.

## 2016-09-10 NOTE — Discharge Instructions (Signed)
Continue the hydrochlorathiazide as prescribed. Take Norvasc (amlodipine) daily as prescribed. Take clonidine (catapress) one tablet every 8 hours if your blood pressure remains systolic  (top number) over 160 or diastolic  (bottom number) over 90. Keep a log of your home blood pressures.

## 2016-09-10 NOTE — ED Notes (Signed)
Nurse is going in and start an IV and will collect labs

## 2016-09-10 NOTE — ED Notes (Signed)
I attempted to collect labs and was unsuccessful. 

## 2016-09-10 NOTE — ED Notes (Signed)
EDP notified that the patient did not have an IV. Dr. Jacqulyn BathLong to attempt with US.

## 2016-09-10 NOTE — Progress Notes (Signed)
Pt in Imaging when CM went to assess for pcp as confirmed by closed door and ED RN

## 2016-09-10 NOTE — ED Notes (Signed)
Attempted x 2 to start Iv and Attempted x 2 to draw labs

## 2016-09-11 ENCOUNTER — Emergency Department (HOSPITAL_COMMUNITY)
Admission: EM | Admit: 2016-09-11 | Discharge: 2016-10-09 | Disposition: E | Payer: Federal, State, Local not specified - PPO | Attending: Emergency Medicine | Admitting: Emergency Medicine

## 2016-09-11 DIAGNOSIS — Z79899 Other long term (current) drug therapy: Secondary | ICD-10-CM | POA: Insufficient documentation

## 2016-09-11 DIAGNOSIS — E119 Type 2 diabetes mellitus without complications: Secondary | ICD-10-CM | POA: Diagnosis not present

## 2016-09-11 DIAGNOSIS — J45909 Unspecified asthma, uncomplicated: Secondary | ICD-10-CM | POA: Diagnosis not present

## 2016-09-11 DIAGNOSIS — I10 Essential (primary) hypertension: Secondary | ICD-10-CM | POA: Diagnosis not present

## 2016-09-11 DIAGNOSIS — Z7984 Long term (current) use of oral hypoglycemic drugs: Secondary | ICD-10-CM | POA: Insufficient documentation

## 2016-09-11 DIAGNOSIS — I469 Cardiac arrest, cause unspecified: Secondary | ICD-10-CM

## 2016-09-11 MED ORDER — EPINEPHRINE PF 1 MG/10ML IJ SOSY
PREFILLED_SYRINGE | INTRAMUSCULAR | Status: AC | PRN
  Administered 2016-09-11 (×2): 1 via INTRAVENOUS

## 2016-09-11 MED ORDER — SODIUM BICARBONATE 8.4 % IV SOLN
INTRAVENOUS | Status: AC | PRN
  Administered 2016-09-11: 50 meq via INTRAVENOUS

## 2016-09-11 MED FILL — Medication: Qty: 1 | Status: AC

## 2016-09-12 LAB — URINE CULTURE: Culture: >=100000 — AB

## 2016-09-13 ENCOUNTER — Telehealth: Payer: Self-pay

## 2016-10-07 ENCOUNTER — Ambulatory Visit: Payer: Federal, State, Local not specified - PPO | Admitting: Internal Medicine

## 2016-10-09 NOTE — ED Provider Notes (Signed)
[facility]-EMERGENCY DEPT Provider Note   CSN: [id number] Arrival date & time:     By signing my name below, I, Sarah Barron, attest that this documentation has been prepared under the direction and in the presence of Derwood Kaplan, MD  Electronically Signed: Clovis Pu, ED Scribe. 09-27-16. 12:14 AM.   History   Chief Complaint Chief Complaint  Patient presents with  . Cardiac Arrest    The history is provided by the EMS personnel. No language interpreter was used.   HPI Comments: LEVEL 5 CAVEAT DUE TO UNRESPONSIVENESS  Sarah Barron is a 61 y.o. female, with a hx of DM and HTN, who presents to the Emergency Department unresponsive x PTA. Unknown downtime. Per EMS personal, pt was given 11 of epi's en route and has been having CPR for 1 hour. When EMS arrived, pt was in vfib, and she is s/p 7 rounds of shocks due to persistent Vfib. Pt also received 300 of amio and another 150 mg of amio.  Pt was seen on 09/08/16 for HTN and on 09/09/16 for numbness and tingling of the right upper and lower extremities.   Past Medical History:  Diagnosis Date  . Allergy   . Arthritis   . Asthma   . Diabetes mellitus without complication (HCC)   . Hypertension     Patient Active Problem List   Diagnosis Date Noted  . HTN (hypertension) 01/19/2013  . Rash, skin 06/30/2012  . Obesity 03/01/2012  . Diabetes mellitus type II 03/01/2012    Past Surgical History:  Procedure Laterality Date  . ABDOMINAL HYSTERECTOMY    . TUBAL LIGATION      OB History    No data available       Home Medications    Prior to Admission medications   Medication Sig Start Date End Date Taking? Authorizing Provider  Alpha-Lipoic Acid 300 MG TABS Take 300 mg by mouth 2 (two) times daily.    Historical Provider, MD  amLODipine (NORVASC) 10 MG tablet Take 1 tablet (10 mg total) by mouth daily. 09/18/2016   Arby Barrette, MD  cephALEXin (KEFLEX) 500 MG capsule Take 1 capsule (500 mg total) by mouth 3 (three)  times daily. 09/08/16   Trixie Dredge, PA-C  CINNAMON PO Take 250 mg by mouth daily.    Historical Provider, MD  cloNIDine (CATAPRES) 0.1 MG tablet 0.1 mg tablet every 8 hours if needed for blood pressure greater than 160/90 after taking your norvasc and HCTZ 10/07/2016   Arby Barrette, MD  Gymnema Sylvestris Leaf POWD Take 400 mg by mouth 2 (two) times daily with a meal.    Historical Provider, MD  hydrochlorothiazide (HYDRODIURIL) 25 MG tablet Take 1 tablet (25 mg total) by mouth daily. 09/08/16   Trixie Dredge, PA-C  HYDROcodone-acetaminophen (NORCO/VICODIN) 5-325 MG tablet Take 1-2 tablets by mouth every 4 (four) hours as needed for moderate pain or severe pain. 09/12/2016   Arby Barrette, MD  metFORMIN (GLUCOPHAGE) 500 MG tablet Take 1 tablet (500 mg total) by mouth 2 (two) times daily with a meal. 09/08/16   Trixie Dredge, PA-C  Multiple Vitamin (MULTIVITAMIN WITH MINERALS) TABS tablet Take 1 tablet by mouth daily.    Historical Provider, MD  OVER THE COUNTER MEDICATION Take 1 tablet by mouth daily. Several Herbal Supplements- Acenaisia, and others but pt Is unable to recall all the names of supplements..    Historical Provider, MD  OVER THE COUNTER MEDICATION Take 1 capsule by mouth 2 (two) times daily.  Swanson Ultra Muscular Comfort Formula  Cherry PURE 250mg  Tumeric 50 g Boswellia serata extract 25 White willow bark extract 25mg  Bromelain 12.5 mg    Historical Provider, MD    Family History No family history on file.  Social History Social History  Substance Use Topics  . Smoking status: Never Smoker  . Smokeless tobacco: Never Used  . Alcohol use No     Allergies   Patient has no known allergies.   Review of Systems Review of Systems  Unable to perform ROS: Patient unresponsive   Physical Exam Updated Vital Signs BP (!) 0/0   Pulse (!) 0   Temp 97.7 F (36.5 C) (Axillary)   Resp (!) 0   Wt 257 lb (116.6 kg)   BMI 44.11 kg/m   Physical Exam  Constitutional: She appears  well-developed.  HENT:  Head: Atraumatic.  Eyes:  Dilated and fixed  Cardiovascular:  asystole  Pulmonary/Chest:  Intubated - cook airway  Abdominal: She exhibits distension.  Neurological:  gcs - 3  Skin: Skin is warm.     ED Treatments / Results  Labs (all labs ordered are listed, but only abnormal results are displayed) Labs Reviewed - No data to display  EKG  EKG Interpretation None       Radiology Ct Head Wo Contrast  Result Date: 2016/09/23 CLINICAL DATA:  Pain radiating from RIGHT hip to foot for 5 days, intermittent RIGHT hand tingling for 2 days, history diabetes mellitus, hypertension EXAM: CT HEAD WITHOUT CONTRAST TECHNIQUE: Contiguous axial images were obtained from the base of the skull through the vertex without intravenous contrast. COMPARISON:  09/08/2016 FINDINGS: Brain: Normal ventricular morphology. No midline shift or mass effect. Normal appearance of brain parenchyma. No intracranial hemorrhage, mass lesion or evidence acute infarction. No extra-axial fluid collections. Vascular: Unremarkable Skull: Intact. Small dural calcifications at a few scattered sites including the RIGHT frontal region, nonspecific but cannot exclude a small RIGHT frontal meningioma. Sinuses/Orbits: Clear Other: N/A IMPRESSION: No acute intracranial abnormalities. Few scattered nonspecific dural calcifications including a more prominent RIGHT frontal dural calcification, cannot exclude small RIGHT frontal meningioma. Electronically Signed   By: Ulyses Southward M.D.   On: 2016/09/23 11:00   [name] Contrast (neuro Protocol)  Result Date: 09/23/2016 CLINICAL DATA:  Initial evaluation for acute right-sided numbness. EXAM: MRI HEAD WITHOUT CONTRAST TECHNIQUE: Multiplanar, multiecho pulse sequences of the brain and surrounding structures were obtained without intravenous contrast. COMPARISON:  Prior CT from earlier the same day. FINDINGS: Brain: Study markedly degraded by motion artifact.  Cerebral volume within normal limits for age. No significant cerebral white matter disease. Diffusion-weighted imaging demonstrates no definite evidence for acute or subacute ischemia. Evaluation fairly limited due to motion. Gray-white matter differentiation maintained. No evidence for acute or chronic intracranial hemorrhage. No evidence for chronic infarction. The no mass lesion, midline shift or mass effect. No hydrocephalus. No extra-axial fluid collection. Major dural sinuses are grossly patent. Incidental note made of an empty sella. Vascular: Major intracranial vascular flow voids are maintained. Skull and upper cervical spine: Chiari 1 malformation present with the cerebellar tonsils extending approximately 15 mm below the foramen magnum. No associated hydrocephalus. Remainder the visualized upper cervical spine grossly unremarkable. Bone marrow signal intensity within normal limits. No scalp soft tissue abnormality. Sinuses/Orbits: Globes and orbital soft tissues within normal limits. Paranasal sinuses are clear. No mastoid effusion. Inner ear structures normal. IMPRESSION: 1. Motion degraded study. No definite acute intracranial infarct or other process identified. 2. Chiari  1 malformation. Electronically Signed   By: Rise MuBenjamin  McClintock M.D.   On: 2017/07/31 18:14    Procedures Procedures (including critical care time) Cardiopulmonary Resuscitation (CPR) Procedure Note Directed/Performed by: Derwood KaplanNanavati, Seeley Hissong I personally directed ancillary staff and/or performed CPR in an effort to regain return of spontaneous circulation and to maintain cardiac, neuro and systemic perfusion.      EMERGENCY DEPARTMENT US CARDIAC EXAM "Study: Limited Ultrasound of the heart and pericardium"  INDICATIONS:Cardiac arrest Multiple views of the heart and pericardium were obtained in real-time with a multi-frequency probe.  PERFORMED WG:NFAOZHBY:Myself  IMAGES ARCHIVED?: No  FINDINGS: No pericardial effusion and No  cardiac activity  LIMITATIONS:  Body habitus and Emergent procedure  VIEWS USED: Subcostal 4 chamber and Parasternal long axis  INTERPRETATION: Cardiac activity absent  CPT Code: 08657-8493308-26 (limited transthoracic cardiac)   Medications Ordered in ED Medications  EPINEPHrine (ADRENALIN) 1 MG/10ML injection (1 Syringe Intravenous Given 10/07/2016 0006)  sodium bicarbonate injection (50 mEq Intravenous Given 09/21/2016 0003)     Initial Impression / Assessment and Plan / ED Course  I have reviewed the triage vital signs and the nursing notes.  Pertinent labs & imaging results that were available during my care of the patient were reviewed by me and considered in my medical decision making (see chart for details).  Clinical Course    Pt comes in after cardiac arrest. Recent ER visit for tingling. Labs and imaging, including brain MRI and troponins normal. PT had received close to an hour of CPR prior to ER arrival and had gone from ROSC to asystole. After 2 more rounds of CPR with epi and bicarb, and echo showing complete cardiac standstill, pt was pronounced at 12:10 am.  Family notified. I will sign the death certificate, as pt doesn't have a pcp.    Final Clinical Impressions(s) / ED Diagnoses   Final diagnoses:  Cardiac arrest [facility](HCC)    New Prescriptions New Prescriptions   No medications on file   I personally performed the services described in this documentation, which was scribed in my presence. The recorded information has been reviewed and is accurate.    Derwood KaplanAnkit Barabara Motz, MD 09/17/2016 [phone]

## 2016-10-09 NOTE — Code Documentation (Signed)
Organ procurement team notified.

## 2016-10-09 NOTE — Progress Notes (Signed)
   09/19/2016 0000  Clinical Encounter Type  Visited With Family  Visit Type Death  Referral From Nurse  Spiritual Encounters  Spiritual Needs Grief support;Emotional;Prayer  [facility] called to meet [facility] after pt death; [facility]  offered spiritual, emotional and grief support along with prayer; [facility]  Gave contact info for family to RN.  12:58 AM Erline LevineMichael I Nirav Sweda

## 2016-10-09 NOTE — Progress Notes (Signed)
On arrival EMS reported that pt had a King airway in place. Pt was manually ventilated 100% while CPR was initiated under the ACLS protocol. Time of death 0008, verified by MD.

## 2016-10-09 NOTE — Code Documentation (Signed)
Pt BIB [facility] as an unwitnessed cardiac arrest. Initial cardiac rhythm v-fib, pt received a total of 10 shocks, epi x 11, 250 mg amiodarone, then 150mg  amiodarone. Pt pulseless, in asystole. Capnography 50 then decreased to 20. King airway placed in the field. CBG 557.

## 2016-10-09 NOTE — Code Documentation (Signed)
Time of death [id number]; verified with bedside ultrasound

## 2016-10-09 NOTE — Code Documentation (Signed)
Dr.Nanavati spoke with family regarding death

## 2016-10-09 DEATH — deceased

## 2017-08-22 IMAGING — CT CT HEAD W/O CM
3 of 4 series · 14 of 47 positions shown, 16 images · non-contrast
Comparison: 09/08/2016

CLINICAL DATA: Pain radiating from RIGHT hip to foot for 5 days,
intermittent RIGHT hand tingling for 2 days, history diabetes
mellitus, hypertension

EXAM:
CT HEAD WITHOUT CONTRAST
TECHNIQUE: Contiguous axial images were obtained from the base of the skull
through the vertex without intravenous contrast.

[Series 3: head w/o · axial · non-contrast · 0.45mm/px · z∈[+1503,+1623]mm · 8 of 30 slices shown, 10 images]
[im 3/30  brain]
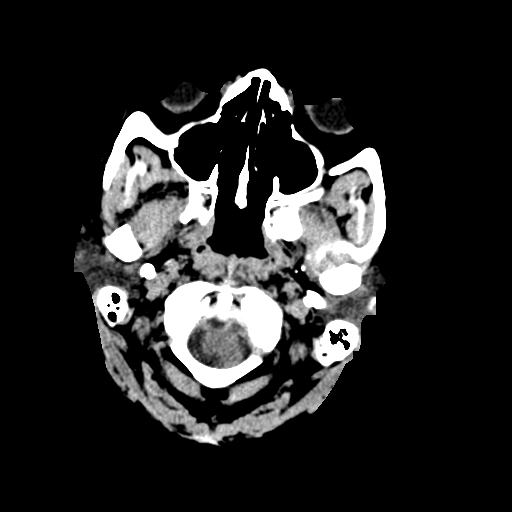
[im 3/30  bone]
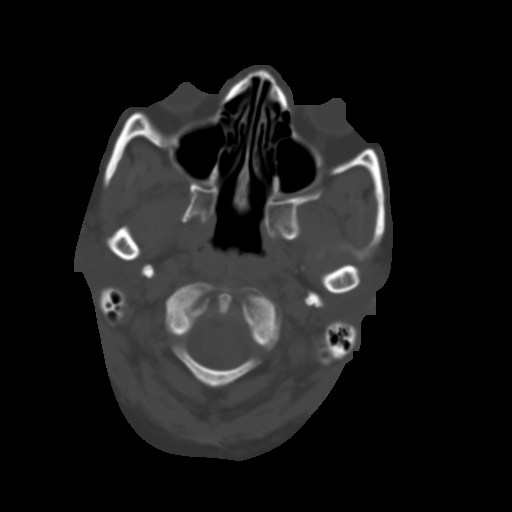
[im 7/30  brain]
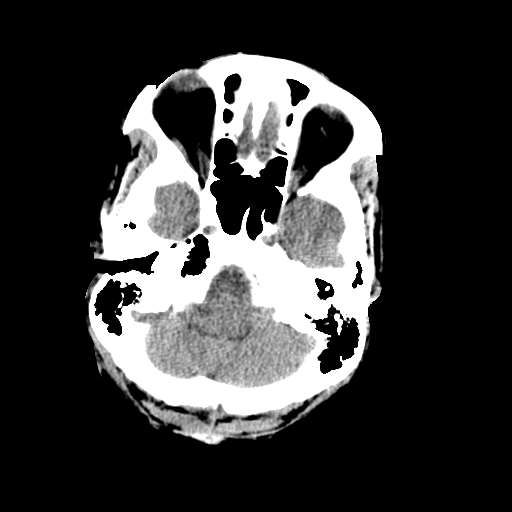
[im 11/30  brain]
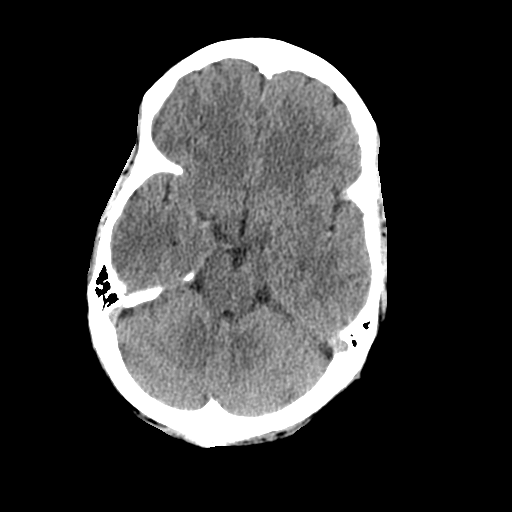
[im 13/30  brain]
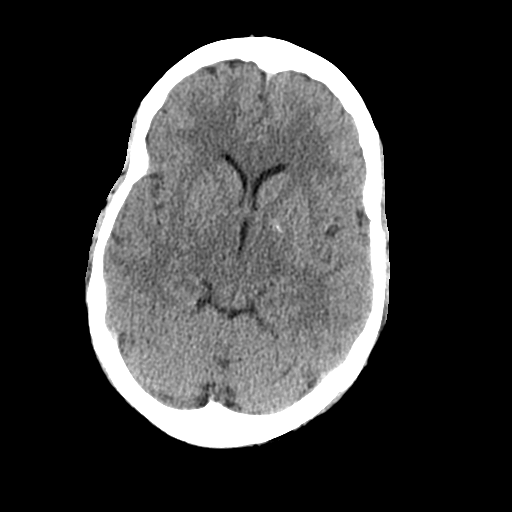
[im 17/30  brain]
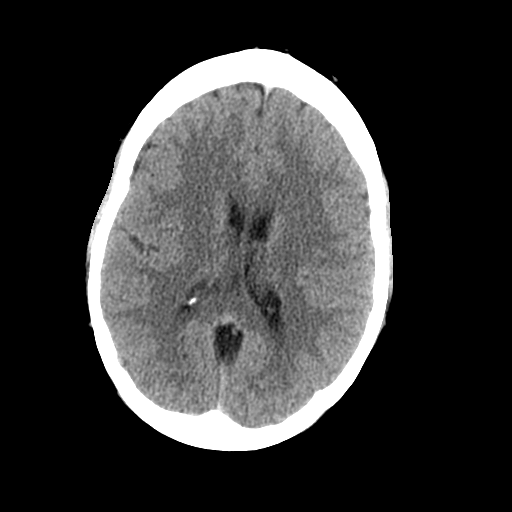
[im 17/30  bone]
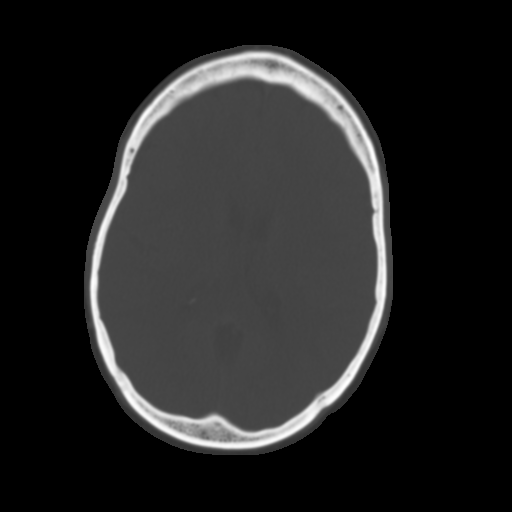
[im 19/30  brain]
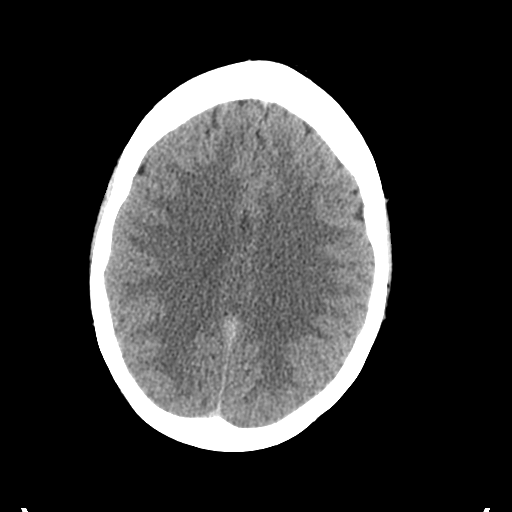
[im 23/30  brain]
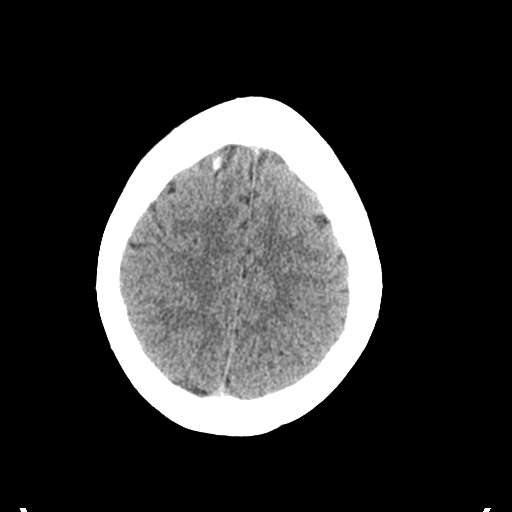
[im 27/30  brain]
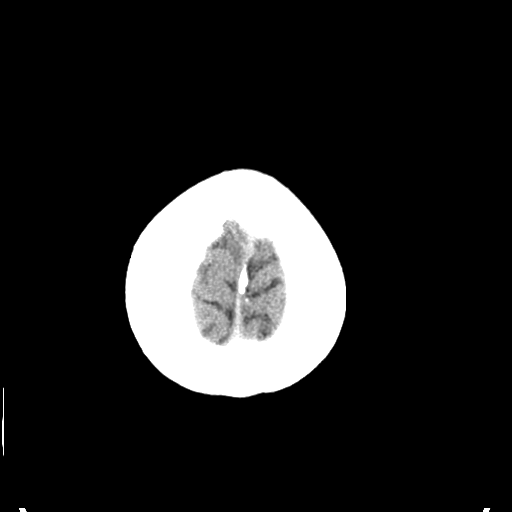

[Series 5: coronal · coronal · 0.30mm/px · 3 of 77 slices shown]
[im 26/77  brain]
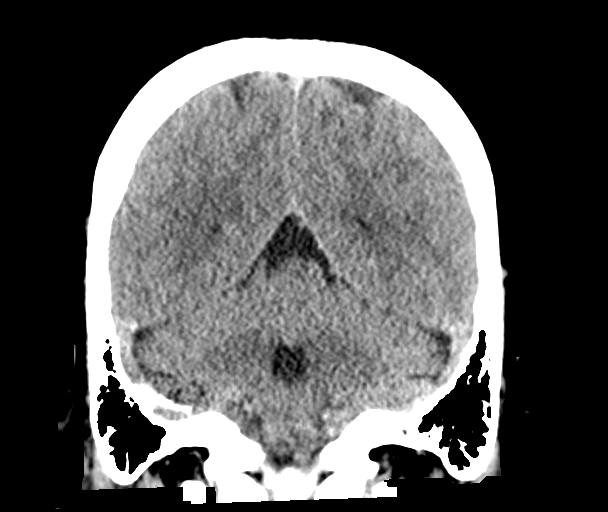
[im 34/77  brain]
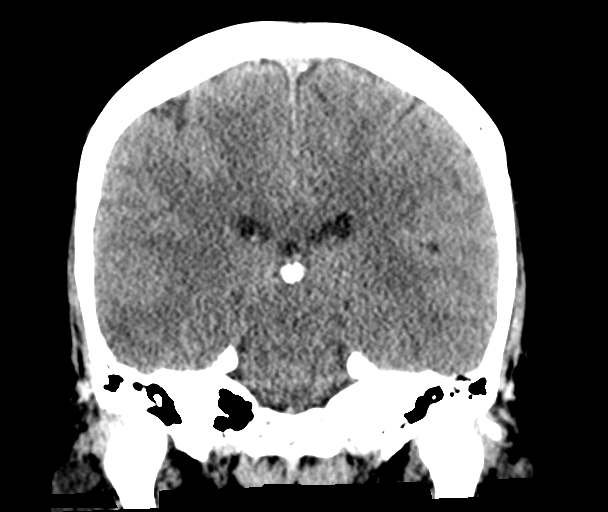
[im 43/77  brain]
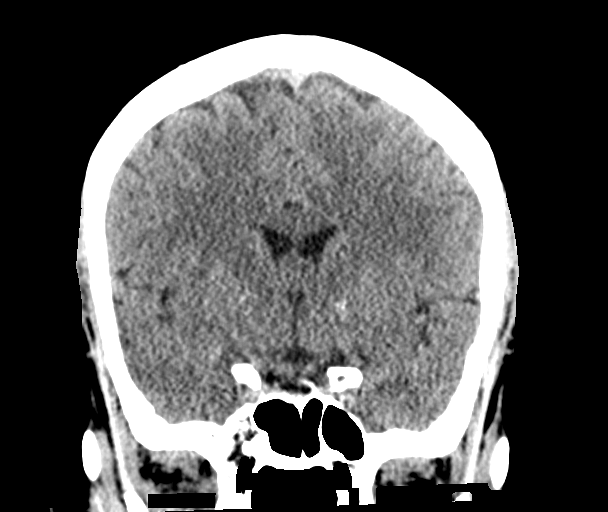

[Series 6: sagittal · sagittal · 0.30mm/px · 3 of 58 slices shown]
[im 20/58  brain]
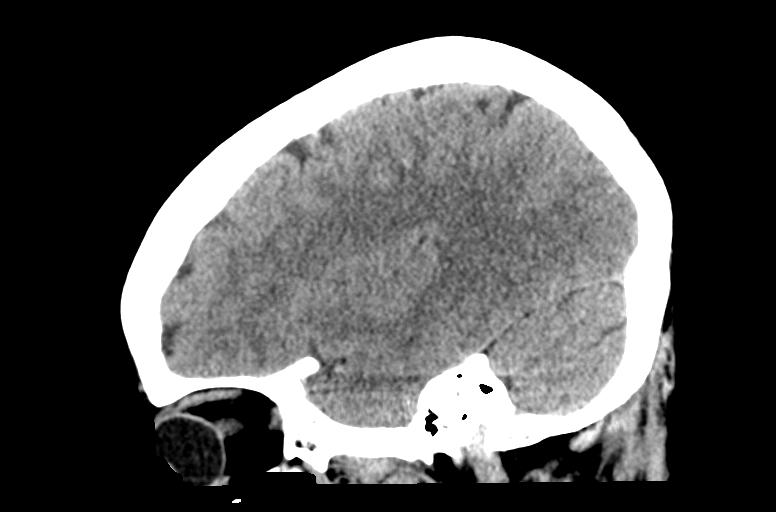
[im 29/58  brain]
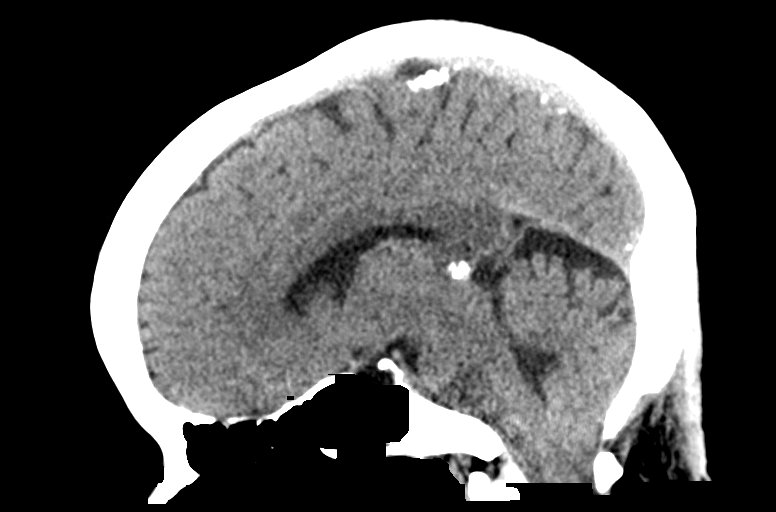
[im 39/58  brain]
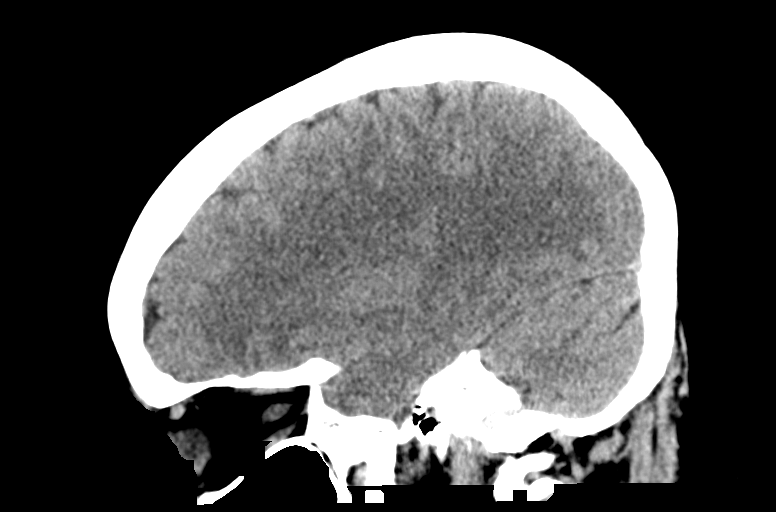

[14 of 47 positions shown; findings below may reference images not displayed]

FINDINGS: Brain: Normal ventricular morphology. No midline shift or mass
effect. Normal appearance of brain parenchyma. No intracranial
hemorrhage, mass lesion or evidence acute infarction. No extra-axial
fluid collections.

Vascular: Unremarkable

Skull: Intact. Small dural calcifications at a few scattered sites
including the RIGHT frontal region, nonspecific but cannot exclude a
small RIGHT frontal meningioma.

Sinuses/Orbits: Clear

Other: N/A
IMPRESSION: No acute intracranial abnormalities.

Few scattered nonspecific dural calcifications including a more
prominent RIGHT frontal dural calcification, cannot exclude small
RIGHT frontal meningioma.
